# Patient Record
Sex: Male | Born: 1965 | Race: White | Hispanic: No | Marital: Single | Smoking: Current every day smoker
Health system: Southern US, Community
[De-identification: ages and names within clinical notes are randomized; demographics above are authoritative.]

## PROBLEM LIST (undated history)

## (undated) DIAGNOSIS — I10 Essential (primary) hypertension: Secondary | ICD-10-CM

## (undated) DIAGNOSIS — M549 Dorsalgia, unspecified: Secondary | ICD-10-CM

## (undated) HISTORY — PX: NO PAST SURGERIES: SHX2092

---

## 2016-12-18 ENCOUNTER — Emergency Department (HOSPITAL_COMMUNITY): Payer: Self-pay

## 2016-12-18 ENCOUNTER — Encounter (HOSPITAL_COMMUNITY): Payer: Self-pay

## 2016-12-18 ENCOUNTER — Emergency Department (HOSPITAL_COMMUNITY)
Admission: EM | Admit: 2016-12-18 | Discharge: 2016-12-18 | Disposition: A | Discharge status: Home / Self Care | Payer: Self-pay | Attending: Emergency Medicine | Admitting: Emergency Medicine

## 2016-12-18 DIAGNOSIS — F17200 Nicotine dependence, unspecified, uncomplicated: Secondary | ICD-10-CM | POA: Insufficient documentation

## 2016-12-18 DIAGNOSIS — S8992XA Unspecified injury of left lower leg, initial encounter: Secondary | ICD-10-CM | POA: Insufficient documentation

## 2016-12-18 DIAGNOSIS — S0510XA Contusion of eyeball and orbital tissues, unspecified eye, initial encounter: Secondary | ICD-10-CM | POA: Insufficient documentation

## 2016-12-18 DIAGNOSIS — Y999 Unspecified external cause status: Secondary | ICD-10-CM | POA: Insufficient documentation

## 2016-12-18 DIAGNOSIS — Y929 Unspecified place or not applicable: Secondary | ICD-10-CM | POA: Insufficient documentation

## 2016-12-18 DIAGNOSIS — S62143A Displaced fracture of body of hamate [unciform] bone, unspecified wrist, initial encounter for closed fracture: Secondary | ICD-10-CM

## 2016-12-18 DIAGNOSIS — S62145A Nondisplaced fracture of body of hamate [unciform] bone, left wrist, initial encounter for closed fracture: Secondary | ICD-10-CM | POA: Insufficient documentation

## 2016-12-18 DIAGNOSIS — Y939 Activity, unspecified: Secondary | ICD-10-CM | POA: Insufficient documentation

## 2016-12-18 MED ORDER — HYDROCODONE-ACETAMINOPHEN 5-325 MG PO TABS
1.0000 | ORAL_TABLET | Freq: Once | ORAL | Status: AC
Start: 2016-12-18 — End: 2016-12-18
  Administered 2016-12-18: 1 via ORAL
  Filled 2016-12-18: qty 1

## 2016-12-18 MED ORDER — NICOTINE 21 MG/24HR TD PT24
21.0000 mg | MEDICATED_PATCH | Freq: Once | TRANSDERMAL | Status: DC
  Administered 2016-12-18: 21 mg via TRANSDERMAL
  Filled 2016-12-18: qty 1

## 2016-12-18 MED ORDER — HYDROCODONE-ACETAMINOPHEN 5-325 MG PO TABS: 1.0000 | ORAL_TABLET | Freq: Four times a day (QID) | ORAL | 0 refills | Status: DC | PRN

## 2016-12-18 NOTE — Discharge Instructions (Addendum)
Keep splint dry and intact. No lifting with left hand. Keep left leg elevated as much as possible and ice for 20 minutes 4x/day  Pain: Take 600 mg of ibuprofen every 6 hours or 440 mg (over the counter dose) to 500 mg (prescription dose) of naproxen every 12 hours or for the next 3 days. After this time, these medications may be used as needed for pain. Take these medications with food to avoid upset stomach. Choose only one of these medications, do not take them together.  May use the Vicodin sparingly for severe pain. Do not drive or perform other dangerous activities while taking the Vicodin. Ice: May apply ice to the area over the next 24 hours for 15 minutes at a time to reduce swelling. Elevation: Keep the extremity elevated as often as possible to reduce pain and inflammation. Exercises (for the knee): Start by performing these exercises a few times a week, increasing the frequency until you are performing them twice daily.  Follow up: Follow up with the orthopedist in about 2 weeks. Call the number provided to set up an appointment.

## 2016-12-18 NOTE — Progress Notes (Signed)
Orthopedic Tech Progress Note Patient Details:  Kyle Singh 02/21/1966 [id number]  Ortho Devices Type of Ortho Device: Ace wrap, Arm sling, Ulna gutter splint Ortho Device/Splint Location: lue Ortho Device/Splint Interventions: Application   Tanja Gift 12/18/2016, 12:46 PM
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
## 2016-12-18 NOTE — ED Notes (Signed)
Patient transported to X-ray 
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
## 2017-02-08 ENCOUNTER — Emergency Department (HOSPITAL_COMMUNITY)
Admission: EM | Admit: 2017-02-08 | Discharge: 2017-02-08 | Disposition: A | Discharge status: Home / Self Care | Payer: Self-pay | Attending: Emergency Medicine | Admitting: Emergency Medicine

## 2017-02-08 ENCOUNTER — Encounter (HOSPITAL_COMMUNITY): Payer: Self-pay | Admitting: Emergency Medicine

## 2017-02-08 ENCOUNTER — Emergency Department (HOSPITAL_COMMUNITY): Payer: Self-pay

## 2017-02-08 DIAGNOSIS — F17200 Nicotine dependence, unspecified, uncomplicated: Secondary | ICD-10-CM | POA: Insufficient documentation

## 2017-02-08 DIAGNOSIS — G8929 Other chronic pain: Secondary | ICD-10-CM | POA: Insufficient documentation

## 2017-02-08 DIAGNOSIS — M25562 Pain in left knee: Secondary | ICD-10-CM | POA: Insufficient documentation
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
## 2017-02-08 NOTE — Discharge Instructions (Signed)
Wear the knee immobilizer when you are up, to prevent the knee joint from moving, which is causing the pain and swelling.  Continue to take an anti-inflammatory medication like ibuprofen 3 times a day.  You will likely need to have some surgery, to improve the left knee discomfort.

## 2017-02-18 ENCOUNTER — Encounter (INDEPENDENT_AMBULATORY_CARE_PROVIDER_SITE_OTHER): Payer: Self-pay | Admitting: Orthopedic Surgery

## 2017-02-18 ENCOUNTER — Ambulatory Visit (INDEPENDENT_AMBULATORY_CARE_PROVIDER_SITE_OTHER): Payer: Self-pay | Admitting: Orthopedic Surgery

## 2017-02-18 DIAGNOSIS — M25562 Pain in left knee: Secondary | ICD-10-CM

## 2017-02-18 MED ORDER — METHYLPREDNISOLONE ACETATE 40 MG/ML IJ SUSP
40.0000 mg | INTRAMUSCULAR | Status: AC | PRN
  Administered 2017-02-18: 40 mg via INTRA_ARTICULAR

## 2017-02-18 MED ORDER — BUPIVACAINE HCL 0.25 % IJ SOLN
4.0000 mL | INTRAMUSCULAR | Status: AC | PRN
  Administered 2017-02-18: 4 mL via INTRA_ARTICULAR

## 2017-02-18 MED ORDER — LIDOCAINE HCL 1 % IJ SOLN
5.0000 mL | INTRAMUSCULAR | Status: AC | PRN
  Administered 2017-02-18: 5 mL

## 2017-02-18 NOTE — Progress Notes (Signed)
Office Visit Note   Patient: Kyle Singh           Date of Birth: 11/30/1965           MRN: [id number] Visit Date: 02/18/2017 Requested by: No referring provider defined for this encounter. PCP: Patient, No Pcp Per  Subjective: Chief Complaint  Patient presents with  . Left Knee - Pain    HPI: Kyle Singh is a 51 year old patient with left knee pain.  Seen in emergency room twice with effusions noted on both radiographs.  Date of injury to her half months ago when he was kicking someone else light.  He does describe swelling weakness giving way and occasional locking.  Been taking ibuprofen for pain.  States the pain is constant and it does wake him from sleep at night.  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  Does not have a history of gout but does have a family history of gout              ROS: All systems reviewed are negative as they relate to the chief complaint within the history of present illness.  Patient denies  fevers or chills.   Assessment & Plan: Visit Diagnoses:  1. Acute pain of left knee     Plan: Impression is left knee effusion with some popping in giving way but difficult examination today due to guarding.  Aspiration yielded about 20 mL of serous fluid.  Plan MRI scan plus injection into the knee.  I'll see him back after that study.  Decent chance that he may have some meniscal pathology present.  Follow-Up Instructions: Return for after MRI.   Orders:  Orders Placed This Encounter  Procedures  . MR Knee Left w/o contrast   No orders of the defined types were placed in this encounter.     Procedures: Large Joint Inj Date/Time: 02/18/2017 12:53 PM Performed by: Cammy CopaEAN, SCOTT Kadin Bera Authorized by: Cammy CopaEAN, SCOTT Gabby Rackers   Consent Given by:  Patient Site marked: the procedure site was marked   Timeout: prior to procedure the correct patient, procedure, and site was verified   Indications:  Pain, joint swelling and diagnostic evaluation Location:   Knee Site:  L knee Prep: patient was prepped and draped in usual sterile fashion   Needle Size:  18 G Needle Length:  1.5 inches Approach:  Superolateral Ultrasound Guidance: No   Fluoroscopic Guidance: No   Arthrogram: No   Medications:  5 mL lidocaine 1 %; 4 mL bupivacaine 0.25 %; 40 mg methylPREDNISolone acetate 40 MG/ML Aspiration Attempted: Yes   Aspirate amount (mL):  20 Aspirate:  Yellow Patient tolerance:  Patient tolerated the procedure well with no immediate complications     Clinical Data: No additional findings.  Objective: Vital Signs: There were no vitals taken for this visit.  Physical Exam:   Constitutional: Patient appears well-developed HEENT:  Head: Normocephalic Eyes:EOM are normal Neck: Normal range of motion Cardiovascular: Normal rate Pulmonary/chest: Effort normal Neurologic: Patient is alert Skin: Skin is warm Psychiatric: Patient has normal mood and affect    Ortho Exam: Orthopedic exam demonstrates antalgic gait to the right with right knee effusion stable collateral ligaments crucial ligaments difficult to assess due to guarding.  Extensor mechanism is intact pedal pulses palpable no other masses lymph adenopathy or skin changes noted in the left knee region.  No groin pain with internal/external rotation of the leg.  Specialty Comments:  No specialty comments available.  Imaging:  No results found.   PMFS History: There are no active problems to display for this patient.  No past medical history on file.  No family history on file.  No past surgical history on file. Social History   Occupational History  . Not on file.   Social History Main Topics  . Smoking status: Current Every Day Smoker    Packs/day: 1.00  . Smokeless tobacco: Never Used  . Alcohol use No  . Drug use: Unknown  . Sexual activity: Not on file

## 2017-02-27 ENCOUNTER — Encounter (INDEPENDENT_AMBULATORY_CARE_PROVIDER_SITE_OTHER): Payer: Self-pay | Admitting: *Deleted

## 2018-08-18 ENCOUNTER — Emergency Department (HOSPITAL_COMMUNITY): Payer: Self-pay

## 2018-08-18 ENCOUNTER — Other Ambulatory Visit: Payer: Self-pay

## 2018-08-18 ENCOUNTER — Emergency Department (HOSPITAL_COMMUNITY)
Admission: EM | Admit: 2018-08-18 | Discharge: 2018-08-18 | Disposition: A | Discharge status: Home / Self Care | Payer: Self-pay | Attending: Emergency Medicine | Admitting: Emergency Medicine

## 2018-08-18 ENCOUNTER — Encounter (HOSPITAL_COMMUNITY): Payer: Self-pay | Admitting: Neurological Surgery

## 2018-08-18 DIAGNOSIS — F17200 Nicotine dependence, unspecified, uncomplicated: Secondary | ICD-10-CM | POA: Insufficient documentation

## 2018-08-18 DIAGNOSIS — M5126 Other intervertebral disc displacement, lumbar region: Secondary | ICD-10-CM | POA: Insufficient documentation

## 2018-08-18 MED ORDER — OXYCODONE-ACETAMINOPHEN 5-325 MG PO TABS: 1.0000 | ORAL_TABLET | ORAL | 0 refills | Status: DC | PRN

## 2018-08-18 MED ORDER — OXYCODONE-ACETAMINOPHEN 5-325 MG PO TABS
1.0000 | ORAL_TABLET | Freq: Once | ORAL | Status: AC
  Administered 2018-08-18: 1 via ORAL
  Filled 2018-08-18: qty 1

## 2018-08-18 MED ORDER — PREDNISONE 10 MG (21) PO TBPK: ORAL_TABLET | Freq: Every day | ORAL | 0 refills | Status: DC

## 2018-08-18 MED ORDER — METHOCARBAMOL 500 MG PO TABS: 500.0000 mg | ORAL_TABLET | Freq: Two times a day (BID) | ORAL | 0 refills | Status: DC

## 2018-08-18 MED ORDER — HYDROMORPHONE HCL 1 MG/ML IJ SOLN
1.0000 mg | Freq: Once | INTRAMUSCULAR | Status: AC
Start: 2018-08-18 — End: 2018-08-18
  Administered 2018-08-18: 1 mg via INTRAMUSCULAR
  Filled 2018-08-18: qty 1

## 2018-08-18 MED ORDER — METHYLPREDNISOLONE SODIUM SUCC 125 MG IJ SOLR
125.0000 mg | Freq: Once | INTRAMUSCULAR | Status: AC
  Administered 2018-08-18: 125 mg via INTRAMUSCULAR
  Filled 2018-08-18: qty 2

## 2018-08-18 MED ORDER — KETOROLAC TROMETHAMINE 60 MG/2ML IM SOLN
60.0000 mg | Freq: Once | INTRAMUSCULAR | Status: AC
  Administered 2018-08-18: 60 mg via INTRAMUSCULAR
  Filled 2018-08-18: qty 2

## 2018-08-18 MED ORDER — DEXAMETHASONE SODIUM PHOSPHATE 10 MG/ML IJ SOLN
10.0000 mg | Freq: Once | INTRAMUSCULAR | Status: AC
  Administered 2018-08-18: 10 mg via INTRAMUSCULAR
  Filled 2018-08-18: qty 1

## 2018-08-18 NOTE — ED Triage Notes (Addendum)
Pt reports right lower back pain that started a week ago and worsened last night. Pt reports a sharp pain that radiates down his right leg. Pt denies any injury to the area. Pt reports that prior to today ambulating after waking up helped ease the pain, but nothing seems to help today. Pt reports that ibuprofen he has been taking at home has not been helping relieve the pain. Pt reports this feels like the last time he had a pinched nerve.

## 2018-08-18 NOTE — ED Provider Notes (Signed)
[facility] EMERGENCY DEPARTMENT Provider Note   CSN: [id number] Arrival date & time: 08/18/18  0844     History   Chief Complaint Chief Complaint  Patient presents with  . Back Pain  . Leg Pain    HPI Kyle Singh is a 53 y.o. male.  The history is provided by the patient. No language interpreter was used.  Back Pain  Location:  Lumbar spine Quality:  Aching Radiates to:  R thigh and R posterior upper leg Pain severity:  Moderate Pain is:  Same all the time Onset quality:  Gradual Duration:  1 week Timing:  Constant Progression:  Worsening Chronicity:  New Relieved by:  Nothing Worsened by:  Nothing Ineffective treatments:  None tried Associated symptoms: leg pain   Leg Pain  Associated symptoms: back pain   Pt reports he has pain in his low back down his right leg.  Pt reports his right leg is weak.  Pt reports he can not lift leg,  No relief with ibuprofen   No past medical history on file.  There are no active problems to display for this patient.   No past surgical history on file.      Home Medications    Prior to Admission medications   Medication Sig Start Date End Date Taking? Authorizing Provider  HYDROcodone-acetaminophen (NORCO/VICODIN) 5-325 MG tablet Take 1 tablet by mouth every 6 (six) hours as needed. 12/18/16   Anselm Pancoast, PA-C    Family History No family history on file.  Social History Social History   Tobacco Use  . Smoking status: Current Every Day Smoker    Packs/day: 1.00  . Smokeless tobacco: Never Used  Substance Use Topics  . Alcohol use: No  . Drug use: Not on file     Allergies   Patient has no known allergies.   Review of Systems Review of Systems  Musculoskeletal: Positive for back pain.  All other systems reviewed and are negative.    Physical Exam Updated Vital Signs BP (!) 139/97   Pulse 72   Temp 98.2 F (36.8 C) (Oral)   Resp 18   Ht 5\' 8"  (1.727 m)   Wt 76.2 kg   SpO2  95%   BMI 25.54 kg/m   Physical Exam Vitals signs and nursing note reviewed.  Constitutional:      Appearance: He is well-developed.  HENT:     Head: Normocephalic.     Nose: Nose normal.     Mouth/Throat:     Mouth: Mucous membranes are moist.  Neck:     Musculoskeletal: Normal range of motion.  Cardiovascular:     Rate and Rhythm: Normal rate and regular rhythm.  Pulmonary:     Effort: Pulmonary effort is normal.  Abdominal:     General: Abdomen is flat. There is no distension.  Musculoskeletal: Normal range of motion.  Neurological:     Mental Status: He is alert and oriented to person, place, and time.      ED Treatments / Results  Labs (all labs ordered are listed, but only abnormal results are displayed) Labs Reviewed - No data to display  EKG None  Radiology Dg Lumbar Spine Complete  Result Date: 08/18/2018 CLINICAL DATA:  Back pain.  Sciatica. EXAM: LUMBAR SPINE - COMPLETE 4+ VIEW COMPARISON:  No prior. FINDINGS: Paraspinal soft tissues are normal. Diffuse multilevel degenerative change. No acute bony abnormality identified. No evidence of fracture. Normal alignment. Pelvic calcifications consistent phleboliths.  Air-filled loops of small large bowel noted consistent adynamic ileus. IMPRESSION: 1. Diffuse multilevel degenerative change. No acute bony abnormality identified. 2.  Air-filled loops of small large bowel suggesting adynamic ileus. Electronically Signed   By: Maisie Fushomas  Register   On: 08/18/2018 10:39    Procedures Procedures (including critical care time)  Medications Ordered in ED Medications  HYDROmorphone (DILAUDID) injection 1 mg (1 mg Intramuscular Given 08/18/18 0948)  ketorolac (TORADOL) injection 60 mg (60 mg Intramuscular Given 08/18/18 0948)  methylPREDNISolone sodium succinate (SOLU-MEDROL) 125 mg/2 mL injection 125 mg (125 mg Intramuscular Given 08/18/18 0948)     Initial Impression / Assessment and Plan / ED Course  I have reviewed the  triage vital signs and the nursing notes.  Pertinent labs & imaging results that were available during my care of the patient were reviewed by me and considered in my medical decision making (see chart for details).     MDM  Pt given injection of Solumedrol, dilaudid and torodol.   Dr. Madilyn Hookees in to see and examine.   MRi ordered   Pt's care turned over to Ria ClockLaura Murray PA MRI pending  Final Clinical Impressions(s) / ED Diagnoses   Final diagnoses:  Lumbar herniated disc    ED Discharge Orders    None       Osie CheeksSofia, Ladan Vanderzanden K, PA-C 08/19/18 1714    Tilden Fossaees, Elizabeth, MD 08/23/18 [phone]

## 2018-08-18 NOTE — ED Notes (Signed)
Patient transported to MRI 

## 2018-08-18 NOTE — ED Notes (Signed)
Patient transported to X-ray 

## 2018-08-18 NOTE — Consult Note (Signed)
Reason for Consult:HNP Referring Physician: EDP  Kyle Singh is an 53 y.o. male.   HPI:  53 year old gentleman who has had right low back and right leg pain for about a week.  He feels like his had weakness in the leg for a couple of days.  He is fallen once.  Has numbness around the right knee.  Pain extends into the right quadricep.  Pain is moderate to moderately severe.  Aching in character.  He is taken only ibuprofen to this point.  He states he has received steroids in the emergency department.  MRI showed herniated disc at L4-5 on the right and neurosurgical evaluation was requested.  His pain level was at its worst last night  History reviewed. No pertinent past medical history.  History reviewed. No pertinent surgical history.  No Known Allergies  Social History   Tobacco Use  . Smoking status: Current Every Day Smoker    Packs/day: 1.00  . Smokeless tobacco: Never Used  Substance Use Topics  . Alcohol use: No    History reviewed. No pertinent family history.   Review of Systems  Positive ROS: neg  All other systems have been reviewed and were otherwise negative with the exception of those mentioned in the HPI and as above.  Objective: Vital signs in last 24 hours: Temp:  [98.2 F (36.8 C)] 98.2 F (36.8 C) (01/29 0852) Pulse Rate:  [72-81] 81 (01/29 1734) Resp:  [17-18] 18 (01/29 1734) BP: (137-139)/(83-111) 137/83 (01/29 1734) SpO2:  [94 %-99 %] 99 % (01/29 1734) Weight:  [76.2 kg] 76.2 kg (01/29 0901)  General Appearance: Alert, cooperative, no distress, appears stated age Head: Normocephalic, without obvious abnormality, atraumatic Eyes: PERRL, conjunctiva/corneas clear, EOM's intact, fundi benign, both eyes      Ears: Normal TM's and external ear canals, both ears Throat: benign Neck: Supple, symmetrical, trachea midline, no adenopathy; thyroid: No enlargement/tenderness/nodules; no carotid bruit or JVD Back: Symmetric, no curvature, ROM normal, no CVA  tenderness Lungs: Clear to auscultation bilaterally, respirations unlabored Heart: Regular rate and rhythm, S1 and S2 normal, no murmur, rub or gallop Abdomen: Soft, non-tender, bowel sounds active all four quadrants, no masses, no organomegaly Extremities: Extremities normal, atraumatic, no cyanosis or edema Pulses: 2+ and symmetric all extremities Skin: Skin color, texture, turgor normal, no rashes or lesions  NEUROLOGIC:   Mental status: A&O x4, no aphasia, good attention span, Memory and fund of knowledge Motor Exam - grossly normal, normal tone and bulk except for right quadricep is 2 out of 5 Sensory Exam - grossly normal Reflexes: symmetric for loss of right knee jerk, no pathologic reflexes, No Hoffman's, No clonus Coordination - grossly normal Gait -not tested Balance -not tested Cranial Nerves: I: smell Not tested  II: visual acuity  OS: na    OD: na  II: visual fields Full to confrontation  II: pupils Equal, round, reactive to light  III,VII: ptosis None  III,IV,VI: extraocular muscles  Full ROM  V: mastication Normal  V: facial light touch sensation  Normal  V,VII: corneal reflex  Present  VII: facial muscle function - upper  Normal  VII: facial muscle function - lower Normal  VIII: hearing Not tested  IX: soft palate elevation  Normal  IX,X: gag reflex Present  XI: trapezius strength  5/5  XI: sternocleidomastoid strength 5/5  XI: neck flexion strength  5/5  XII: tongue strength  Normal    Data Review No results found for: WBC, HGB, HCT, MCV, PLT  No results found for: NA, K, CL, CO2, BUN, CREATININE, GLUCOSE No results found for: INR, PROTIME  Radiology: Dg Lumbar Spine Complete  Result Date: 08/18/2018 CLINICAL DATA:  Back pain.  Sciatica. EXAM: LUMBAR SPINE - COMPLETE 4+ VIEW COMPARISON:  No prior. FINDINGS: Paraspinal soft tissues are normal. Diffuse multilevel degenerative change. No acute bony abnormality identified. No evidence of fracture. Normal  alignment. Pelvic calcifications consistent phleboliths. Air-filled loops of small large bowel noted consistent adynamic ileus. IMPRESSION: 1. Diffuse multilevel degenerative change. No acute bony abnormality identified. 2.  Air-filled loops of small large bowel suggesting adynamic ileus. Electronically Signed   By: Maisie Fus  Register   On: 08/18/2018 10:39   Mr Lumbar Spine Wo Contrast  Result Date: 08/18/2018 CLINICAL DATA:  Right-sided low back pain beginning 1 week ago, worsening last night. Pain radiates to the right leg. EXAM: MRI LUMBAR SPINE WITHOUT CONTRAST TECHNIQUE: Multiplanar, multisequence MR imaging of the lumbar spine was performed. No intravenous contrast was administered. COMPARISON:  Radiography same day FINDINGS: Segmentation:  5 lumbar type vertebral bodies. Alignment:  Straightening of the normal lumbar lordosis. Vertebrae:  No fracture or primary bone lesion. Conus medullaris and cauda equina: Conus extends to the L1 level. Conus and cauda equina appear normal. Paraspinal and other soft tissues: Negative Disc levels: T12-L1 is normal. L1-2: Minimal desiccation and bulging of the disc. No compressive stenosis. L2-3: Biforaminal disc bulges. No central canal stenosis. Mild foraminal narrowing without visible neural compression. L3-4: Biforaminal disc bulges larger on the right than the left. No central canal stenosis. Mild to moderate foraminal narrowing without visible neural compression. L4-5: Right foraminal disc herniation likely to compress the right L4 nerve. Foraminal disc bulge on the left without visible neural compression. Bilateral lateral recess stenosis which could possibly affect either or both L5 nerves. L5-S1: Disc bulge more prominent towards the left. Mild facet hypertrophy. Narrowing of the subarticular lateral recesses and foramina left more than right. Some potential for neural compression on the left. IMPRESSION: The dominant and likely acute finding is a right foraminal  disc herniation at L4-5 likely to compress the right L4 nerve. Also at that level, there is bilateral lateral recess stenosis and mild left foraminal narrowing. L2-3: Bilateral foraminal disc bulges but without visible neural compression. L3-4: Bilateral foraminal disc bulges right more than left, but without visible neural compression. L5-S1: Disc bulge more prominent towards the left. Facet hypertrophy. Narrowing of the subarticular lateral recesses and foramina left more than right. Some potential for neural compression at this level, particularly on the left. Electronically Signed   By: Paulina Fusi M.D.   On: 08/18/2018 16:20     Assessment/Plan: Estimated body mass index is 25.54 kg/m as calculated from the following:   Height as of this encounter: 5\' 8"  (1.727 m).   Weight as of this encounter: 41.54 kg.   53 year old gentleman with a extraforaminal disc protrusion L4-5 on the right causing right L4 radiculopathy and fairly significant weakness of the right quadricep.  I have recommended early surgery because of the weakness, but he refuses admission and surgery at this time.  He would "like to give it a few days."  He understands the risks in this include permanent weakness of the leg.  He understands that early surgery does not guarantee return of strength and lack of surgery does not guarantee loss of strength, however he understands that early surgery would certainly improve the likelihood of return of leg strength.  Recommend discharge home with prescriptions  for medications to palliate the pain.  I suggested a Medrol Dosepak or other type of steroid taper to put him on steroids to reduce inflammation of the neural elements.  He will follow-up with me in the office on Tuesday.  My name and number are in the chart and should appear on his paperwork.  Gone over all of this with him in detail and at length and answered all of his questions the best my ability and has demonstrated  understanding.   Tia AlertDavid S Jones 08/18/2018 6:29 PM

## 2018-08-18 NOTE — ED Provider Notes (Signed)
Care received at shift change, briefly, patient with low back pain x 1 week, right leg weakness/difficulty walking since last night. No falls or injuries. No previous back problems. Sleeping in recliner due to pain lying supine/in bed.  Unable to extend lower leg, able to flex hip with pain, no foot/great toe weakness. absent patella reflex. Awaiting MRI lumbar spine. Physical Exam  BP 137/83 (BP Location: Left Arm)   Pulse 81   Temp 98.2 F (36.8 C) (Oral)   Resp 18   Ht 5\' 8"  (1.727 m)   Wt 76.2 kg   SpO2 99%   BMI 25.54 kg/m   Physical Exam  ED Course/Procedures     Procedures  MDM  Case discussed with on call neurosurgery, recommends 10mg  Decadron, Attending physician is on site in the hospital, will come and see patient. Discussed plan of care with patient who will be moved to a room.  Patient was seen by neurosurgery, note reviewed, patient would like to be discharged today.  Given prescription for Percocet, Robaxin, prednisone.  Patient to follow-up with neurosurgery in 6 days, advised return to ER for new or worsening symptoms.       Jeannie Fend, PA-C 08/18/18 1901    Tilden Fossa, MD 08/23/18 [phone]

## 2018-08-18 NOTE — Discharge Instructions (Addendum)
Follow-up with neurosurgery as directed.  Return to ER for any new or worsening symptoms. Take scription's as directed including prednisone taper and complete the full course.  Take Percocet as needed as prescribed for pain, do not drive or operate machinery while taking Percocet.  Take Robaxin as needed for muscle spasm.

## 2018-08-23 ENCOUNTER — Encounter (HOSPITAL_COMMUNITY): Payer: Self-pay

## 2018-08-23 ENCOUNTER — Emergency Department (HOSPITAL_COMMUNITY): Payer: Self-pay

## 2018-08-23 ENCOUNTER — Other Ambulatory Visit: Payer: Self-pay

## 2018-08-23 ENCOUNTER — Inpatient Hospital Stay (HOSPITAL_COMMUNITY)
Admission: EM | Admit: 2018-08-23 | Discharge: 2018-08-26 | DRG: 520 | Disposition: A | Discharge status: Home / Self Care | Payer: Self-pay | Attending: Neurological Surgery | Admitting: Neurological Surgery

## 2018-08-23 DIAGNOSIS — M5126 Other intervertebral disc displacement, lumbar region: Principal | ICD-10-CM | POA: Diagnosis present

## 2018-08-23 DIAGNOSIS — F1410 Cocaine abuse, uncomplicated: Secondary | ICD-10-CM | POA: Diagnosis present

## 2018-08-23 DIAGNOSIS — Z9889 Other specified postprocedural states: Secondary | ICD-10-CM

## 2018-08-23 DIAGNOSIS — I10 Essential (primary) hypertension: Secondary | ICD-10-CM | POA: Diagnosis present

## 2018-08-23 DIAGNOSIS — M5441 Lumbago with sciatica, right side: Secondary | ICD-10-CM

## 2018-08-23 DIAGNOSIS — Z419 Encounter for procedure for purposes other than remedying health state, unspecified: Secondary | ICD-10-CM

## 2018-08-23 DIAGNOSIS — Z79891 Long term (current) use of opiate analgesic: Secondary | ICD-10-CM

## 2018-08-23 DIAGNOSIS — M549 Dorsalgia, unspecified: Secondary | ICD-10-CM | POA: Diagnosis present

## 2018-08-23 DIAGNOSIS — F17210 Nicotine dependence, cigarettes, uncomplicated: Secondary | ICD-10-CM | POA: Diagnosis present

## 2018-08-23 DIAGNOSIS — Z7952 Long term (current) use of systemic steroids: Secondary | ICD-10-CM

## 2018-08-23 HISTORY — DX: Essential (primary) hypertension: I10

## 2018-08-23 LAB — CBC
HCT: 46.4 % (ref 39.0–52.0)
HCT: 49.1 % (ref 39.0–52.0)
Hemoglobin: 16.2 g/dL (ref 13.0–17.0)
Hemoglobin: 16.3 g/dL (ref 13.0–17.0)
MCH: 31.3 pg (ref 26.0–34.0)
MCH: 30.1 pg (ref 26.0–34.0)
MCHC: 34.9 g/dL (ref 30.0–36.0)
MCHC: 33.2 g/dL (ref 30.0–36.0)
MCV: 89.7 fL (ref 80.0–100.0)
MCV: 90.8 fL (ref 80.0–100.0)
NRBC: 0 % (ref 0.0–0.2)
PLATELETS: 227 10*3/uL (ref 150–400)
Platelets: 218 10*3/uL (ref 150–400)
RBC: 5.17 MIL/uL (ref 4.22–5.81)
RBC: 5.41 MIL/uL (ref 4.22–5.81)
RDW: 11.7 % (ref 11.5–15.5)
RDW: 11.5 % (ref 11.5–15.5)
WBC: 17.1 10*3/uL — AB (ref 4.0–10.5)
WBC: 14.5 10*3/uL — ABNORMAL HIGH (ref 4.0–10.5)
nRBC: 0 % (ref 0.0–0.2)

## 2018-08-23 LAB — BASIC METABOLIC PANEL
Anion gap: 12 (ref 5–15)
BUN: 25 mg/dL — ABNORMAL HIGH (ref 6–20)
CALCIUM: 8.7 mg/dL — AB (ref 8.9–10.3)
CO2: 23 mmol/L (ref 22–32)
Chloride: 103 mmol/L (ref 98–111)
Creatinine, Ser: 0.97 mg/dL (ref 0.61–1.24)
GFR calc Af Amer: >60 mL/min (ref >60)
GFR calc non Af Amer: >60 mL/min (ref >60)
Glucose, Bld: 116 mg/dL — ABNORMAL HIGH (ref 70–99)
Potassium: 4 mmol/L (ref 3.5–5.1)
Sodium: 138 mmol/L (ref 135–145)

## 2018-08-23 LAB — RAPID URINE DRUG SCREEN, HOSP PERFORMED
Amphetamines: NOT DETECTED
Barbiturates: NOT DETECTED
Benzodiazepines: NOT DETECTED
COCAINE: NOT DETECTED
Opiates: POSITIVE — AB
Tetrahydrocannabinol: POSITIVE — AB

## 2018-08-23 LAB — CREATININE, SERUM
Creatinine, Ser: 1 mg/dL (ref 0.61–1.24)
GFR calc Af Amer: >60 mL/min (ref >60)
GFR calc non Af Amer: >60 mL/min (ref >60)

## 2018-08-23 LAB — PROTIME-INR
INR: 0.95
Prothrombin Time: 12.6 seconds (ref 11.4–15.2)

## 2018-08-23 MED ORDER — IBUPROFEN 600 MG PO TABS: 600.0000 mg | ORAL_TABLET | Freq: Four times a day (QID) | ORAL | Status: DC | PRN

## 2018-08-23 MED ORDER — LACTATED RINGERS IV SOLN
INTRAVENOUS | Status: AC
  Administered 2018-08-24 (×2): via INTRAVENOUS

## 2018-08-23 MED ORDER — DEXAMETHASONE SODIUM PHOSPHATE 10 MG/ML IJ SOLN
10.0000 mg | Freq: Four times a day (QID) | INTRAMUSCULAR | Status: DC
  Administered 2018-08-23 – 2018-08-24 (×3): 10 mg via INTRAVENOUS
  Filled 2018-08-23 (×3): qty 1

## 2018-08-23 MED ORDER — BISACODYL 5 MG PO TBEC: 5.0000 mg | DELAYED_RELEASE_TABLET | Freq: Every day | ORAL | Status: DC | PRN

## 2018-08-23 MED ORDER — SODIUM CHLORIDE 0.9% FLUSH
3.0000 mL | Freq: Two times a day (BID) | INTRAVENOUS | Status: DC
  Administered 2018-08-23 – 2018-08-25 (×4): 3 mL via INTRAVENOUS

## 2018-08-23 MED ORDER — HYDRALAZINE HCL 20 MG/ML IJ SOLN: 10.0000 mg | Freq: Four times a day (QID) | INTRAMUSCULAR | Status: DC | PRN

## 2018-08-23 MED ORDER — HYDROMORPHONE HCL 1 MG/ML IJ SOLN
1.0000 mg | INTRAMUSCULAR | Status: DC | PRN
  Administered 2018-08-23 – 2018-08-25 (×11): 1 mg via INTRAVENOUS
  Filled 2018-08-23 (×11): qty 1

## 2018-08-23 MED ORDER — OXYCODONE-ACETAMINOPHEN 7.5-325 MG PO TABS
1.0000 | ORAL_TABLET | ORAL | Status: DC | PRN
  Administered 2018-08-23 – 2018-08-24 (×5): 1 via ORAL
  Filled 2018-08-23 (×5): qty 1

## 2018-08-23 MED ORDER — AMLODIPINE BESYLATE 10 MG PO TABS
10.0000 mg | ORAL_TABLET | Freq: Every day | ORAL | Status: DC
  Administered 2018-08-24: 10 mg via ORAL
  Filled 2018-08-23: qty 2
  Filled 2018-08-23: qty 1

## 2018-08-23 MED ORDER — BACLOFEN 10 MG PO TABS
10.0000 mg | ORAL_TABLET | Freq: Three times a day (TID) | ORAL | Status: DC | PRN
  Filled 2018-08-23: qty 1

## 2018-08-23 MED ORDER — DEXAMETHASONE SODIUM PHOSPHATE 10 MG/ML IJ SOLN
10.0000 mg | Freq: Once | INTRAMUSCULAR | Status: AC
  Administered 2018-08-23: 10 mg via INTRAVENOUS
  Filled 2018-08-23: qty 1

## 2018-08-23 MED ORDER — METHOCARBAMOL 1000 MG/10ML IJ SOLN
1000.0000 mg | Freq: Once | INTRAMUSCULAR | Status: DC
  Filled 2018-08-23: qty 10

## 2018-08-23 MED ORDER — SODIUM CHLORIDE 0.9 % IV BOLUS: 1000.0000 mL | Freq: Once | INTRAVENOUS | Status: DC

## 2018-08-23 MED ORDER — HEPARIN SODIUM (PORCINE) 5000 UNIT/ML IJ SOLN
5000.0000 [IU] | Freq: Three times a day (TID) | INTRAMUSCULAR | Status: DC
  Administered 2018-08-23 – 2018-08-24 (×2): 5000 [IU] via SUBCUTANEOUS
  Filled 2018-08-23 (×2): qty 1

## 2018-08-23 MED ORDER — MORPHINE SULFATE (PF) 4 MG/ML IV SOLN
4.0000 mg | Freq: Once | INTRAVENOUS | Status: AC
  Administered 2018-08-23: 4 mg via INTRAVENOUS
  Filled 2018-08-23: qty 1

## 2018-08-23 NOTE — ED Triage Notes (Signed)
Pt reports recent dx of herniated disc with a pinched nerve that's causing severe pain.  Pt reports passing out last night this morning from the pain.  Pt denies falling with he passed out that "Ive been on th couch."  Pt reports taking 3 medications that were prescribed with no relief.

## 2018-08-23 NOTE — H&P (Signed)
TRH H&P   Patient Demographics:    Kyle Singh, is a 53 y.o. male  MRN: [id number]   DOB - 01/30/1966  Admit Date - 08/23/2018  Outpatient Primary MD for the patient is Patient, No Pcp Per  Outpatient Specialists: Dr Yetta BarreJones    Patient coming from: Home  Chief Complaint  Patient presents with  . Back Pain      HPI:    Kyle LekKeith Broman  is a 53 y.o. male, with no previous medical history except occasional cocaine abuse, left excruciating acute onset low back pain on 08/18/2018, he denies any injuries to that site, he says that he was sleeping in a recliner and woke up with pain, he had an MRI in the ER that day which showed herniated disc at L4-L5 level, he was seen by neurosurgeon Dr. Yetta BarreJones and a surgical correction was offered but patient declined and went home.  He comes back today with worsening of his low back pain which is radiating to his right leg, he also complains of some numbness in the right side of his thigh and some weakness of his foot.  Denies any bowel or bladder incontinence.  Denies any paresthesias in his buttocks area.  In the ER blood work is still pending, neurosurgery was consulted and we were requested to admit for pain control.  Denies any fever chills, no other medical problems, denies any IV drug use ever.    Review of systems:    A full 10 point Review of Systems was done, except as stated above, all other Review of Systems were negative.   With Past History of the following :    History reviewed. No pertinent past medical history.    History reviewed. No pertinent surgical history.    Social History:     Social History   Tobacco Use  . Smoking status: Current Every Day Smoker   Packs/day: 1.00  . Smokeless tobacco: Never Used  Substance Use Topics  . Alcohol use: No         Family History :   No disc problems or premature CAD in the family   Home Medications:   Prior to Admission medications   Medication Sig Start Date End Date Taking? Authorizing Provider  HYDROcodone-acetaminophen (NORCO/VICODIN) 5-325 MG tablet Take 1 tablet by mouth every 6 (six) hours  as needed. 12/18/16   Joy, Shawn C, PA-C  methocarbamol (ROBAXIN) 500 MG tablet Take 1 tablet (500 mg total) by mouth 2 (two) times daily. 08/18/18   Jeannie FendMurphy, Laura A, PA-C  oxyCODONE-acetaminophen (PERCOCET/ROXICET) 5-325 MG tablet Take 1 tablet by mouth every 4 (four) hours as needed for severe pain. 08/18/18   Jeannie FendMurphy, Laura A, PA-C  predniSONE (STERAPRED UNI-PAK 21 TAB) 10 MG (21) TBPK tablet Take by mouth daily. Take 6 tabs by mouth daily  for 2 days, then 5 tabs for 2 days, then 4 tabs for 2 days, then 3 tabs for 2 days, 2 tabs for 2 days, then 1 tab by mouth daily for 2 days 08/18/18   Jeannie FendMurphy, Laura A, PA-C     Allergies:    No Known Allergies   Physical Exam:   Vitals  Blood pressure (!) 148/106, pulse 82, temperature 98 F (36.7 C), temperature source Oral, resp. rate 18, height 5\' 8"  (1.727 m), weight 76.2 kg, SpO2 96 %.   1. General Young Caucasian male with multiple tattoos all over lying in hospital bed in moderate to severe distress due to low back pain,  2. Normal affect and insight, Not Suicidal or Homicidal, Awake Alert, Oriented X 3.  3. No F.N deficits, ALL C.Nerves Intact, strength is 5 x 5 in all 4 extremities except for right foot weakness in dorsiflexion, also numbness in L4-L5 dermatome in the right medial aspect of his thigh.  Unable to extend his x-rays right hip due to pain.  4. Ears and Eyes appear Normal, Conjunctivae clear, PERRLA. Moist Oral Mucosa.  5. Supple Neck, No JVD, No cervical lymphadenopathy appriciated, No Carotid Bruits.  6. Symmetrical Chest wall movement,  Good air movement bilaterally, CTAB.  7. RRR, No Gallops, Rubs or Murmurs, No Parasternal Heave.  8. Positive Bowel Sounds, Abdomen Soft, No tenderness, No organomegaly appriciated,No rebound -guarding or rigidity.  9.  No Cyanosis, Normal Skin Turgor, No Skin Rash or Bruise.  10. Good muscle tone,  joints appear normal , no effusions, Normal ROM.  11. No Palpable Lymph Nodes in Neck or Axillae      Data Review:    CBC Recent Labs  Lab 08/23/18 1743  WBC 17.1*  HGB 16.2  HCT 46.4  PLT 218  MCV 89.7  MCH 31.3  MCHC 34.9  RDW 11.7   ------------------------------------------------------------------------------------------------------------------  Chemistries  No results for input(s): NA, K, CL, CO2, GLUCOSE, BUN, CREATININE, CALCIUM, MG, AST, ALT, ALKPHOS, BILITOT in the last 168 hours.  Invalid input(s): GFRCGP ------------------------------------------------------------------------------------------------------------------ CrCl cannot be calculated (No successful lab value found.). ------------------------------------------------------------------------------------------------------------------ No results for input(s): TSH, T4TOTAL, T3FREE, THYROIDAB in the last 72 hours.  Invalid input(s): FREET3  Coagulation profile No results for input(s): INR, PROTIME in the last 168 hours. ------------------------------------------------------------------------------------------------------------------- No results for input(s): DDIMER in the last 72 hours. -------------------------------------------------------------------------------------------------------------------  Cardiac Enzymes No results for input(s): CKMB, TROPONINI, MYOGLOBIN in the last 168 hours.  Invalid input(s): CK ------------------------------------------------------------------------------------------------------------------ No results found for:  BNP   ---------------------------------------------------------------------------------------------------------------  Urinalysis No results found for: COLORURINE, APPEARANCEUR, LABSPEC, PHURINE, GLUCOSEU, HGBUR, BILIRUBINUR, KETONESUR, PROTEINUR, UROBILINOGEN, NITRITE, LEUKOCYTESUR  ----------------------------------------------------------------------------------------------------------------   Imaging Results:    Dg Chest Port 1 View  Result Date: 08/23/2018 CLINICAL DATA:  Herniated disc, pinched nerve, severe pain, preoperative evaluation EXAM: PORTABLE CHEST 1 VIEW COMPARISON:  Portable exam 1744 hours without priors for comparison FINDINGS: Normal heart size, mediastinal contours, and pulmonary vascularity. Lungs clear. No pleural effusion or pneumothorax. Bones unremarkable. IMPRESSION:  No acute abnormalities. Electronically Signed   By: Ulyses Southward M.D.   On: 08/23/2018 17:54    EKG and BMP are pending   Assessment & Plan:    Active Problems:   Back pain    1.  L4-L5 disc prolapse with low back pain which is excruciating, right foot weakness, right leg paresthesias.  Patient was offered surgical correction few days ago but he declined, now unable to stand or walk, in considerable discomfort, will be admitted to MedSurg, IV steroids, IV narcotics along with oral narcotics and sedatives, gentle hydration, likely has reactive leukocytosis will monitor.  Case discussed with Dr. Yetta Barre he will operate likely on 08/25/2018.  2.  Active leukocytosis.  Likely reactive, no fevers, denies any IV drug use.  Monitor.  Chest x-ray stable.  3.  Occasional cocaine abuse.  Last use 1 month ago.  Counseled to quit.  4.  HTN.  Likely pain related.  Placed on Norvasc along with PRN hydralazine.   Monitor BMP and EKG which are pending.   DVT Prophylaxis Heparin   AM Labs Ordered, also please review Full Orders  Family Communication: Admission, patients condition and plan of care including  tests being ordered have been discussed with the patient  who indicate understanding and agree with the plan and Code Status.  Code Status Full  Likely DC to Home 3-4   Condition GUARDED    Consults called: N. Surg   Admission status: Inpt    Time spent in minutes : 35   Susa Raring M.D on 08/23/2018 at 6:07 PM  To page go to [email or web address] - password [facility]

## 2018-08-23 NOTE — ED Provider Notes (Addendum)
[name]   CSN: [id number] Arrival date & time: 08/23/18  1508     History   Chief Complaint Chief Complaint  Patient presents with  . Back Pain    HPI Kyle Singh is a 53 y.o. male.  HPI  53 yo male presents today with severe right-sided back pain.  He states he was seen here on January 29.  He had a herniated disc and right leg weakness.  Dr. Yetta BarreJones saw him and advised going to the operating room.  Patient wanted to avoid surgery and went home.  He is having increasing pain.  He is having severe pain and states he is unable to tolerate.  The leg weakness has continued.  He has not lost control of his bowel or bladder or sensation.  He states he is unable to extend his right leg and does not have reflexes.  Review of Dr. Yetta BarreJones note is consistent with this.  History reviewed. No pertinent past medical history.  Patient Active Problem List   Diagnosis Date Noted  . Back pain 08/23/2018    History reviewed. No pertinent surgical history.      Home Medications    Prior to Admission medications   Medication Sig Start Date End Date Taking? Authorizing Provider  HYDROcodone-acetaminophen (NORCO/VICODIN) 5-325 MG tablet Take 1 tablet by mouth every 6 (six) hours as needed. 12/18/16   Joy, Shawn C, PA-C  methocarbamol (ROBAXIN) 500 MG tablet Take 1 tablet (500 mg total) by mouth 2 (two) times daily. 08/18/18   Jeannie FendMurphy, Laura A, PA-C  oxyCODONE-acetaminophen (PERCOCET/ROXICET) 5-325 MG tablet Take 1 tablet by mouth every 4 (four) hours as needed for severe pain. 08/18/18   Jeannie FendMurphy, Laura A, PA-C  predniSONE (STERAPRED UNI-PAK 21 TAB) 10 MG (21) TBPK tablet Take by mouth daily. Take 6 tabs by mouth daily  for 2 days, then 5 tabs for 2 days, then 4 tabs for 2 days, then 3 tabs for 2 days, 2 tabs for 2 days, then 1 tab by mouth daily for 2 days 08/18/18   Jeannie FendMurphy, Laura A, PA-C    Family History History reviewed. No pertinent family  history.  Social History Social History   Tobacco Use  . Smoking status: Current Every Day Smoker    Packs/day: 1.00  . Smokeless tobacco: Never Used  Substance Use Topics  . Alcohol use: No  . Drug use: Not on file     Allergies   Patient has no known allergies.   Review of Systems Review of Systems  All other systems reviewed and are negative.    Physical Exam Updated Vital Signs BP (!) 148/106 (BP Location: Right Arm)   Pulse 82   Temp 98 F (36.7 C) (Oral)   Resp 18   Ht 1.727 m (5\' 8" )   Wt 76.2 kg   SpO2 96%   BMI 25.54 kg/m   Physical Exam Vitals signs and nursing note reviewed.  Constitutional:      General: He is in acute distress.  HENT:     Head: Normocephalic.     Right Ear: External ear normal.     Left Ear: External ear normal.     Nose: Nose normal.     Mouth/Throat:     Mouth: Mucous membranes are moist.  Eyes:     Pupils: Pupils are equal, round, and reactive to light.  Neck:     Musculoskeletal: Normal range of motion.  Cardiovascular:  Rate and Rhythm: Normal rate and regular rhythm.  Pulmonary:     Effort: Pulmonary effort is normal.     Breath sounds: Normal breath sounds.  Abdominal:     General: Abdomen is flat.  Musculoskeletal:     Comments: Patient unable to extend right leg  Skin:    General: Skin is warm and dry.     Capillary Refill: Capillary refill takes less than 2 seconds.  Neurological:     Mental Status: He is alert.     Cranial Nerves: No cranial nerve deficit.     Sensory: No sensory deficit.     Motor: Weakness present.     Coordination: Coordination normal.     Comments: Patient with no patellar reflex on the right Decreased ability to extend right leg at knee. He is able to flex at hip      ED Treatments / Results  Labs (all labs ordered are listed, but only abnormal results are displayed) Labs Reviewed  CBC - Abnormal; Notable for the following components:      Result Value   WBC 17.1 (*)      All other components within normal limits  BASIC METABOLIC PANEL - Abnormal; Notable for the following components:   Glucose, Bld 116 (*)    BUN 25 (*)    Calcium 8.7 (*)    All other components within normal limits  HIV ANTIBODY (ROUTINE TESTING W REFLEX)  CBC  CREATININE, SERUM  PROTIME-INR  CBC  BASIC METABOLIC PANEL    EKG None  Radiology Dg Chest Port 1 View  Result Date: 08/23/2018 CLINICAL DATA:  Herniated disc, pinched nerve, severe pain, preoperative evaluation EXAM: PORTABLE CHEST 1 VIEW COMPARISON:  Portable exam 1744 hours without priors for comparison FINDINGS: Normal heart size, mediastinal contours, and pulmonary vascularity. Lungs clear. No pleural effusion or pneumothorax. Bones unremarkable. IMPRESSION: No acute abnormalities. Electronically Signed   By: Ulyses Southward M.D.   On: 08/23/2018 17:54    Procedures Procedures (including critical care time)  Medications Ordered in ED Medications  sodium chloride 0.9 % bolus 1,000 mL (1,000 mLs Intravenous Refused 08/23/18 1704)  methocarbamol (ROBAXIN) injection 1,000 mg (has no administration in time range)  dexamethasone (DECADRON) injection 10 mg (has no administration in time range)  oxyCODONE-acetaminophen (PERCOCET) 7.5-325 MG per tablet 1 tablet (has no administration in time range)  HYDROmorphone (DILAUDID) injection 1 mg (has no administration in time range)  ibuprofen (ADVIL,MOTRIN) tablet 600 mg (has no administration in time range)  lactated ringers infusion (has no administration in time range)  heparin injection 5,000 Units (has no administration in time range)  sodium chloride flush (NS) 0.9 % injection 3 mL (has no administration in time range)  bisacodyl (DULCOLAX) EC tablet 5 mg (has no administration in time range)  hydrALAZINE (APRESOLINE) injection 10 mg (has no administration in time range)  amLODipine (NORVASC) tablet 10 mg (has no administration in time range)  morphine 4 MG/ML injection 4 mg  (4 mg Intravenous Given 08/23/18 1702)  dexamethasone (DECADRON) injection 10 mg (10 mg Intravenous Given 08/23/18 1702)     Initial Impression / Assessment and Plan / ED Course  I have reviewed the triage vital signs and the nursing notes.  Pertinent labs & imaging results that were available during my care of the patient were reviewed by me and considered in my medical decision making (see chart for details).  Clinical Course as of Aug 27 1540  Mon Aug 23, 2018  4975  Suspect acute demargination with steroids and pain  WBC(!): 17.1 [DR]    Clinical Course User Index [DR] Margarita Grizzleay, Jeanice Dempsey, MD    Discussed care with Dr. Lovell SheehanJenkins, on-call for Dr Yetta BarreJones.  Discussed care with Dr. Thedore MinsSingh the hospitalist service with neurosurgery to consult in the morning Patient with acute neurological deficit Unchanged from last week Treated here with Decadron, Robaxin, and pain medication Final Clinical Impressions(s) / ED Diagnoses   Final diagnoses:  Herniated lumbar intervertebral disc    ED Discharge Orders    None       Margarita Grizzleay, Akari Crysler, MD 08/23/18 Herbie [id number]    Margarita Grizzleay, Manus Weedman, MD 08/27/18 [phone]

## 2018-08-24 ENCOUNTER — Other Ambulatory Visit: Payer: Self-pay | Admitting: Neurological Surgery

## 2018-08-24 DIAGNOSIS — F1410 Cocaine abuse, uncomplicated: Secondary | ICD-10-CM | POA: Diagnosis present

## 2018-08-24 DIAGNOSIS — I10 Essential (primary) hypertension: Secondary | ICD-10-CM | POA: Diagnosis present

## 2018-08-24 LAB — BASIC METABOLIC PANEL
Anion gap: 12 (ref 5–15)
BUN: 23 mg/dL — ABNORMAL HIGH (ref 6–20)
CO2: 24 mmol/L (ref 22–32)
Calcium: 8.8 mg/dL — ABNORMAL LOW (ref 8.9–10.3)
Chloride: 101 mmol/L (ref 98–111)
Creatinine, Ser: 1.02 mg/dL (ref 0.61–1.24)
GFR calc Af Amer: >60 mL/min (ref >60)
GFR calc non Af Amer: >60 mL/min (ref >60)
Glucose, Bld: 149 mg/dL — ABNORMAL HIGH (ref 70–99)
Potassium: 4.6 mmol/L (ref 3.5–5.1)
Sodium: 137 mmol/L (ref 135–145)

## 2018-08-24 LAB — CBC
HCT: 48.3 % (ref 39.0–52.0)
Hemoglobin: 16.7 g/dL (ref 13.0–17.0)
MCH: 31.2 pg (ref 26.0–34.0)
MCHC: 34.6 g/dL (ref 30.0–36.0)
MCV: 90.1 fL (ref 80.0–100.0)
NRBC: 0 % (ref 0.0–0.2)
PLATELETS: 257 10*3/uL (ref 150–400)
RBC: 5.36 MIL/uL (ref 4.22–5.81)
RDW: 11.8 % (ref 11.5–15.5)
WBC: 16.9 10*3/uL — ABNORMAL HIGH (ref 4.0–10.5)

## 2018-08-24 LAB — HIV ANTIBODY (ROUTINE TESTING W REFLEX): HIV Screen 4th Generation wRfx: NONREACTIVE

## 2018-08-24 MED ORDER — ONDANSETRON HCL 4 MG/2ML IJ SOLN
4.0000 mg | Freq: Four times a day (QID) | INTRAMUSCULAR | Status: DC | PRN
  Administered 2018-08-24 – 2018-08-25 (×2): 4 mg via INTRAVENOUS
  Filled 2018-08-24 (×2): qty 2

## 2018-08-24 MED ORDER — ALUM & MAG HYDROXIDE-SIMETH 200-200-20 MG/5ML PO SUSP
30.0000 mL | Freq: Four times a day (QID) | ORAL | Status: DC | PRN
  Administered 2018-08-24: 30 mL via ORAL
  Filled 2018-08-24 (×2): qty 30

## 2018-08-24 NOTE — Progress Notes (Signed)
Subjective: Patient reports a lot of pain over night mostly in his right lateral thigh and shin. Does report some back pain. Still has weakness in his right leg  Objective: Vital signs in last 24 hours: Temp:  [97.5 F (36.4 C)-98 F (36.7 C)] 97.6 F (36.4 C) (02/04 0531) Pulse Rate:  [67-82] 70 (02/04 0531) Resp:  [12-18] 12 (02/04 0531) BP: (131-154)/(83-106) 131/84 (02/04 0531) SpO2:  [96 %-98 %] 96 % (02/04 0531) Weight:  [75 kg-76.2 kg] 75 kg (02/04 0110)  Intake/Output from previous day: 02/03 0701 - 02/04 0700 In: 195 [I.V.:195] Out: 470 [Urine:470] Intake/Output this shift: Total I/O In: 330 [I.V.:330] Out: -   Neurologic: Grossly normal except right quad strength is 1/5  Lab Results: Lab Results  Component Value Date   WBC 16.9 (H) 08/24/2018   HGB 16.7 08/24/2018   HCT 48.3 08/24/2018   MCV 90.1 08/24/2018   PLT 257 08/24/2018   Lab Results  Component Value Date   INR 0.95 08/23/2018   BMET Lab Results  Component Value Date   NA 137 08/24/2018   K 4.6 08/24/2018   CL 101 08/24/2018   CO2 24 08/24/2018   GLUCOSE 149 (H) 08/24/2018   BUN 23 (H) 08/24/2018   CREATININE 1.02 08/24/2018   CALCIUM 8.8 (L) 08/24/2018    Studies/Results: Dg Chest Port 1 View  Result Date: 08/23/2018 CLINICAL DATA:  Herniated disc, pinched nerve, severe pain, preoperative evaluation EXAM: PORTABLE CHEST 1 VIEW COMPARISON:  Portable exam 1744 hours without priors for comparison FINDINGS: Normal heart size, mediastinal contours, and pulmonary vascularity. Lungs clear. No pleural effusion or pneumothorax. Bones unremarkable. IMPRESSION: No acute abnormalities. Electronically Signed   By: Ulyses Southward M.D.   On: 08/23/2018 17:54    Assessment/Plan: Patient stable, will plan for a microdisk tomorrow.   LOS: 1 day    Tiana Loft Talor Cheema 08/24/2018, 8:20 AM

## 2018-08-24 NOTE — Progress Notes (Signed)
Kyle Singh is a 53 y.o. male patient admitted from ED awake, alert - oriented  X 4 .  VSS - Blood pressure (!) 154/83, pulse 67, temperature (!) 97.5 F (36.4 C), temperature source Oral, resp. rate 16, height 5\' 8"  (1.727 m), weight 75 kg, SpO2 97 %.    IV in place, occlusive dsg intact without redness.  Orientation to room, and floor completed patient. Admission INP armband ID verified with patient/family, and in place.  SR up x 2, fall assessment complete, with patient able to verbalize understanding of risk associated with falls, and verbalized understanding to call nsg before up out of bed.  Call light within reach, patient able to voice, and demonstrate understanding.  Skin, clean-dry- intact without evidence of bruising, or skin tears.   No evidence of skin break down noted on exam.     Will cont to eval and treat per MD orders.  Dierdre Forth, RN 08/24/2018 1:48 AM

## 2018-08-24 NOTE — Progress Notes (Signed)
PROGRESS NOTE                                                                                                                                                                                                             Patient Demographics:    Kyle Singh, is a 53 y.o. male, DOB - 02-05-1966, KZL:[id number]  Admit date - 08/23/2018   Admitting Physician Leroy Sea, MD  Outpatient Primary MD for the patient is Patient, No Pcp Per  LOS - 1  Chief Complaint  Patient presents with  . Back Pain       Brief Narrative   Kyle Singh  is a 53 y.o. male, with no previous medical history except occasional cocaine abuse, left excruciating acute onset low back pain on 08/18/2018, he denies any injuries to that site, he says that he was sleeping in a recliner and woke up with pain, he had an MRI in the ER that day which showed herniated disc at L4-L5 level, he was seen by neurosurgeon Dr. Yetta Barre and a surgical correction was offered but patient declined and went home.  He comes back today with worsening of his low back pain which is radiating to his right leg, he also complains of some numbness in the right side of his thigh and some weakness of his foot.  Denies any bowel or bladder incontinence.  Denies any paresthesias in his buttocks area.  In the ER blood work is still pending, neurosurgery was consulted and we were requested to admit for pain control.  Denies any fever chills, no other medical problems, denies any IV drug use ever.   Subjective:    Kyle Singh today has, No headache, No chest pain, No abdominal pain - No Nausea, No new weakness tingling or numbness, No Cough - SOB. +ve low back pain.   Assessment  & Plan :     1.  L4-L5 disc prolapse with low back pain which is excruciating, right foot weakness, right leg paresthesias.  Patient was offered surgical correction few days ago but he declined, now unable to stand or walk, in considerable discomfort, but currently  on IV steroids IV narcotics, continue gentle hydration plan is to take him to the OR for surgical correction on 08/25/2018 discussed the case with Dr. Yetta Barre.  2.  Reactive leukocytosis.  Likely reactive, no fevers, denies any IV drug use.  Monitor.  Chest x-ray stable.  Now exposure to steroids as well.  Will monitor.  3.  Occasional cocaine and marijuana abuse.  Last use 1 month ago.  Counseled to quit.  4.  HTN.  Likely pain related.  Placed on Norvasc along with PRN hydralazine.  Able.    Family Communication  : None  Code Status :  Full  Disposition Plan  :  TBD  Consults  :  N.Surg  Procedures  :    DVT Prophylaxis  :  SCDs    Lab Results  Component Value Date   PLT 257 08/24/2018    Diet :  Diet Order            Diet NPO time specified  Diet effective midnight        Diet regular Room service appropriate? Yes; Fluid consistency: Thin  Diet effective now               Inpatient Medications Scheduled Meds: . amLODipine  10 mg Oral Daily  . dexamethasone  10 mg Intravenous Q6H  . sodium chloride flush  3 mL Intravenous Q12H   Continuous Infusions: . lactated ringers 75 mL/hr at 08/24/18 0100  . sodium chloride     PRN Meds:.baclofen, bisacodyl, hydrALAZINE, HYDROmorphone (DILAUDID) injection, ibuprofen, oxyCODONE-acetaminophen  Antibiotics  :   Anti-infectives (From admission, onward)   None          Objective:   Vitals:   08/23/18 2032 08/24/18 0058 08/24/18 0110 08/24/18 0531  BP: (!) 141/83 (!) 143/83 (!) 154/83 131/84  Pulse: 67 68 67 70  Resp: 16 14 16 12   Temp:   (!) 97.5 F (36.4 C) 97.6 F (36.4 C)  TempSrc:   Oral Oral  SpO2: 97% 98% 97% 96%  Weight:   75 kg   Height:   5\' 8"  (1.727 m)     Wt Readings from Last 3 Encounters:  08/24/18 75 kg  08/18/18 76.2 kg  12/18/16 77.1 kg     Intake/Output Summary (Last 24 hours) at 08/24/2018 0906 Last data filed at 08/24/2018 0800 Gross per 24 hour  Intake 525 ml  Output 470 ml  Net  55 ml     Physical Exam  Awake Alert, Oriented X 3, strength is 5 x 5 in all 4 extremities except for right foot weakness in dorsiflexion, also numbness in L4-L5 dermatome in the right medial aspect of his thigh.  Unable to extend his x-rays right hip due to pain. .AT,PERRAL Supple Neck,No JVD, No cervical lymphadenopathy appriciated.  Symmetrical Chest wall movement, Good air movement bilaterally, CTAB RRR,No Gallops,Rubs or new Murmurs, No Parasternal Heave +ve B.Sounds, Abd Soft, No tenderness, No organomegaly appriciated, No rebound - guarding or rigidity. No Cyanosis, Clubbing or edema, No new Rash or bruise       Data Review:    CBC Recent Labs  Lab 08/23/18 1743 08/23/18 2005 08/24/18 0514  WBC 17.1* 14.5* 16.9*  HGB 16.2 16.3 16.7  HCT 46.4 49.1 48.3  PLT 218 227 257  MCV 89.7 90.8 90.1  MCH 31.3 30.1 31.2  MCHC 34.9 33.2 34.6  RDW 11.7 11.5 11.8    Chemistries  Recent Labs  Lab 08/23/18 1743 08/23/18 2005 08/24/18 0514  NA 138  --  137  K 4.0  --  4.6  CL 103  --  101  CO2 23  --  24  GLUCOSE 116*  --  149*  BUN 25*  --  23*  CREATININE 0.97 1.00 1.02  CALCIUM 8.7*  --  8.8*   ------------------------------------------------------------------------------------------------------------------ No results for  input(s): CHOL, HDL, LDLCALC, TRIG, CHOLHDL, LDLDIRECT in the last 72 hours.  No results found for: HGBA1C ------------------------------------------------------------------------------------------------------------------ No results for input(s): TSH, T4TOTAL, T3FREE, THYROIDAB in the last 72 hours.  Invalid input(s): FREET3 ------------------------------------------------------------------------------------------------------------------ No results for input(s): VITAMINB12, FOLATE, FERRITIN, TIBC, IRON, RETICCTPCT in the last 72 hours.  Coagulation profile Recent Labs  Lab 08/23/18 2005  INR 0.95    No results for input(s): DDIMER in the  last 72 hours.  Cardiac Enzymes No results for input(s): CKMB, TROPONINI, MYOGLOBIN in the last 168 hours.  Invalid input(s): CK ------------------------------------------------------------------------------------------------------------------ No results found for: BNP  Micro Results No results found for this or any previous visit (from the past 240 hour(s)).  Radiology Reports Dg Lumbar Spine Complete  Result Date: 08/18/2018 CLINICAL DATA:  Back pain.  Sciatica. EXAM: LUMBAR SPINE - COMPLETE 4+ VIEW COMPARISON:  No prior. FINDINGS: Paraspinal soft tissues are normal. Diffuse multilevel degenerative change. No acute bony abnormality identified. No evidence of fracture. Normal alignment. Pelvic calcifications consistent phleboliths. Air-filled loops of small large bowel noted consistent adynamic ileus. IMPRESSION: 1. Diffuse multilevel degenerative change. No acute bony abnormality identified. 2.  Air-filled loops of small large bowel suggesting adynamic ileus. Electronically Signed   By: Maisie Fus  Register   On: 08/18/2018 10:39   Mr Lumbar Spine Wo Contrast  Result Date: 08/18/2018 CLINICAL DATA:  Right-sided low back pain beginning 1 week ago, worsening last night. Pain radiates to the right leg. EXAM: MRI LUMBAR SPINE WITHOUT CONTRAST TECHNIQUE: Multiplanar, multisequence MR imaging of the lumbar spine was performed. No intravenous contrast was administered. COMPARISON:  Radiography same day FINDINGS: Segmentation:  5 lumbar type vertebral bodies. Alignment:  Straightening of the normal lumbar lordosis. Vertebrae:  No fracture or primary bone lesion. Conus medullaris and cauda equina: Conus extends to the L1 level. Conus and cauda equina appear normal. Paraspinal and other soft tissues: Negative Disc levels: T12-L1 is normal. L1-2: Minimal desiccation and bulging of the disc. No compressive stenosis. L2-3: Biforaminal disc bulges. No central canal stenosis. Mild foraminal narrowing without  visible neural compression. L3-4: Biforaminal disc bulges larger on the right than the left. No central canal stenosis. Mild to moderate foraminal narrowing without visible neural compression. L4-5: Right foraminal disc herniation likely to compress the right L4 nerve. Foraminal disc bulge on the left without visible neural compression. Bilateral lateral recess stenosis which could possibly affect either or both L5 nerves. L5-S1: Disc bulge more prominent towards the left. Mild facet hypertrophy. Narrowing of the subarticular lateral recesses and foramina left more than right. Some potential for neural compression on the left. IMPRESSION: The dominant and likely acute finding is a right foraminal disc herniation at L4-5 likely to compress the right L4 nerve. Also at that level, there is bilateral lateral recess stenosis and mild left foraminal narrowing. L2-3: Bilateral foraminal disc bulges but without visible neural compression. L3-4: Bilateral foraminal disc bulges right more than left, but without visible neural compression. L5-S1: Disc bulge more prominent towards the left. Facet hypertrophy. Narrowing of the subarticular lateral recesses and foramina left more than right. Some potential for neural compression at this level, particularly on the left. Electronically Signed   By: Paulina Fusi M.D.   On: 08/18/2018 16:20   Dg Chest Port 1 View  Result Date: 08/23/2018 CLINICAL DATA:  Herniated disc, pinched nerve, severe pain, preoperative evaluation EXAM: PORTABLE CHEST 1 VIEW COMPARISON:  Portable exam 1744 hours without priors for comparison FINDINGS: Normal heart size, mediastinal contours, and pulmonary  vascularity. Lungs clear. No pleural effusion or pneumothorax. Bones unremarkable. IMPRESSION: No acute abnormalities. Electronically Signed   By: Ulyses Southward M.D.   On: 08/23/2018 17:54    Time Spent in minutes  30   Susa Raring M.D on 08/24/2018 at 9:06 AM  To page go to [email or web address] - password  [facility]

## 2018-08-24 NOTE — Plan of Care (Signed)
  Problem: Clinical Measurements: Goal: Will remain free from infection Outcome: Progressing   Problem: Pain Managment: Goal: General experience of comfort will improve Outcome: Not Progressing   Problem: Safety: Goal: Ability to remain free from injury will improve Outcome: Progressing

## 2018-08-25 ENCOUNTER — Inpatient Hospital Stay (HOSPITAL_COMMUNITY): Payer: Self-pay

## 2018-08-25 ENCOUNTER — Encounter (HOSPITAL_COMMUNITY)
Admission: EM | Disposition: A | Discharge status: Home / Self Care | Payer: Self-pay | Source: Home / Self Care | Attending: Internal Medicine

## 2018-08-25 ENCOUNTER — Encounter (HOSPITAL_COMMUNITY): Payer: Self-pay | Admitting: General Practice

## 2018-08-25 ENCOUNTER — Inpatient Hospital Stay (HOSPITAL_COMMUNITY): Payer: Self-pay | Admitting: Certified Registered"

## 2018-08-25 DIAGNOSIS — Z9889 Other specified postprocedural states: Secondary | ICD-10-CM

## 2018-08-25 HISTORY — PX: LUMBAR LAMINECTOMY/DECOMPRESSION MICRODISCECTOMY: SHX5026

## 2018-08-25 LAB — SURGICAL PCR SCREEN
MRSA, PCR: NEGATIVE
Staphylococcus aureus: NEGATIVE

## 2018-08-25 SURGERY — LUMBAR LAMINECTOMY/DECOMPRESSION MICRODISCECTOMY 1 LEVEL
Anesthesia: General | Site: Back | Laterality: Right

## 2018-08-25 MED ORDER — PHENOL 1.4 % MT LIQD: 1.0000 | OROMUCOSAL | Status: DC | PRN

## 2018-08-25 MED ORDER — THROMBIN 5000 UNITS EX SOLR
CUTANEOUS | Status: AC
  Filled 2018-08-25: qty 10000

## 2018-08-25 MED ORDER — MIDAZOLAM HCL 2 MG/2ML IJ SOLN
INTRAMUSCULAR | Status: AC
  Filled 2018-08-25: qty 2

## 2018-08-25 MED ORDER — SUCCINYLCHOLINE CHLORIDE 200 MG/10ML IV SOSY
PREFILLED_SYRINGE | INTRAVENOUS | Status: DC | PRN
  Administered 2018-08-25: 200 mg via INTRAVENOUS

## 2018-08-25 MED ORDER — DEXAMETHASONE SODIUM PHOSPHATE 10 MG/ML IJ SOLN
INTRAMUSCULAR | Status: AC
  Filled 2018-08-25: qty 1

## 2018-08-25 MED ORDER — KETOROLAC TROMETHAMINE 30 MG/ML IJ SOLN
INTRAMUSCULAR | Status: AC
  Filled 2018-08-25: qty 1

## 2018-08-25 MED ORDER — ROCURONIUM BROMIDE 50 MG/5ML IV SOSY
PREFILLED_SYRINGE | INTRAVENOUS | Status: DC | PRN
  Administered 2018-08-25: 20 mg via INTRAVENOUS
  Administered 2018-08-25: 50 mg via INTRAVENOUS

## 2018-08-25 MED ORDER — FENTANYL CITRATE (PF) 250 MCG/5ML IJ SOLN
INTRAMUSCULAR | Status: AC
  Filled 2018-08-25: qty 5

## 2018-08-25 MED ORDER — SODIUM CHLORIDE 0.9% FLUSH: 3.0000 mL | INTRAVENOUS | Status: DC | PRN

## 2018-08-25 MED ORDER — SUGAMMADEX SODIUM 200 MG/2ML IV SOLN
INTRAVENOUS | Status: DC | PRN
  Administered 2018-08-25: 150 mg via INTRAVENOUS

## 2018-08-25 MED ORDER — SODIUM CHLORIDE 0.9% FLUSH
3.0000 mL | Freq: Two times a day (BID) | INTRAVENOUS | Status: DC
  Administered 2018-08-25: 3 mL via INTRAVENOUS

## 2018-08-25 MED ORDER — ONDANSETRON HCL 4 MG/2ML IJ SOLN
INTRAMUSCULAR | Status: AC
  Filled 2018-08-25: qty 2

## 2018-08-25 MED ORDER — ONDANSETRON HCL 4 MG/2ML IJ SOLN: 4.0000 mg | Freq: Four times a day (QID) | INTRAMUSCULAR | Status: DC | PRN

## 2018-08-25 MED ORDER — HYDROMORPHONE HCL 1 MG/ML IJ SOLN
0.5000 mg | INTRAMUSCULAR | Status: DC | PRN
  Administered 2018-08-25 – 2018-08-26 (×5): 0.5 mg via INTRAVENOUS
  Filled 2018-08-25 (×5): qty 0.5

## 2018-08-25 MED ORDER — METHOCARBAMOL 500 MG PO TABS
ORAL_TABLET | ORAL | Status: AC
  Filled 2018-08-25: qty 1

## 2018-08-25 MED ORDER — CHLORHEXIDINE GLUCONATE CLOTH 2 % EX PADS: 6.0000 | MEDICATED_PAD | Freq: Once | CUTANEOUS | Status: DC

## 2018-08-25 MED ORDER — FENTANYL CITRATE (PF) 100 MCG/2ML IJ SOLN
INTRAMUSCULAR | Status: AC
  Filled 2018-08-25: qty 2

## 2018-08-25 MED ORDER — SODIUM CHLORIDE 0.9 % IV SOLN: 250.0000 mL | INTRAVENOUS | Status: DC

## 2018-08-25 MED ORDER — LACTATED RINGERS IV SOLN
INTRAVENOUS | Status: DC
  Administered 2018-08-25 (×2): via INTRAVENOUS

## 2018-08-25 MED ORDER — MENTHOL 3 MG MT LOZG: 1.0000 | LOZENGE | OROMUCOSAL | Status: DC | PRN

## 2018-08-25 MED ORDER — SUCCINYLCHOLINE CHLORIDE 200 MG/10ML IV SOSY
PREFILLED_SYRINGE | INTRAVENOUS | Status: AC
  Filled 2018-08-25: qty 10

## 2018-08-25 MED ORDER — CEFAZOLIN SODIUM-DEXTROSE 2-4 GM/100ML-% IV SOLN
2.0000 g | Freq: Three times a day (TID) | INTRAVENOUS | Status: AC
  Administered 2018-08-25 – 2018-08-26 (×2): 2 g via INTRAVENOUS
  Filled 2018-08-25 (×2): qty 100

## 2018-08-25 MED ORDER — CEFAZOLIN SODIUM-DEXTROSE 2-4 GM/100ML-% IV SOLN
2.0000 g | INTRAVENOUS | Status: AC
  Administered 2018-08-25: 2 g via INTRAVENOUS

## 2018-08-25 MED ORDER — OXYCODONE HCL 5 MG PO TABS
ORAL_TABLET | ORAL | Status: AC
  Administered 2018-08-25: 5 mg
  Filled 2018-08-25: qty 1

## 2018-08-25 MED ORDER — SODIUM CHLORIDE 0.9 % IV SOLN
INTRAVENOUS | Status: DC | PRN
  Administered 2018-08-25: 17:00:00

## 2018-08-25 MED ORDER — ONDANSETRON HCL 4 MG PO TABS: 4.0000 mg | ORAL_TABLET | Freq: Four times a day (QID) | ORAL | Status: DC | PRN

## 2018-08-25 MED ORDER — MIDAZOLAM HCL 2 MG/2ML IJ SOLN
INTRAMUSCULAR | Status: DC | PRN
  Administered 2018-08-25: 2 mg via INTRAVENOUS

## 2018-08-25 MED ORDER — LIDOCAINE 2% (20 MG/ML) 5 ML SYRINGE
INTRAMUSCULAR | Status: AC
  Filled 2018-08-25: qty 5

## 2018-08-25 MED ORDER — CELECOXIB 200 MG PO CAPS
200.0000 mg | ORAL_CAPSULE | Freq: Two times a day (BID) | ORAL | Status: DC
  Administered 2018-08-25: 200 mg via ORAL
  Filled 2018-08-25 (×2): qty 1

## 2018-08-25 MED ORDER — ACETAMINOPHEN 650 MG RE SUPP: 650.0000 mg | RECTAL | Status: DC | PRN

## 2018-08-25 MED ORDER — BUPIVACAINE HCL (PF) 0.25 % IJ SOLN
INTRAMUSCULAR | Status: DC | PRN
  Administered 2018-08-25: 2 mL

## 2018-08-25 MED ORDER — ROCURONIUM BROMIDE 50 MG/5ML IV SOSY
PREFILLED_SYRINGE | INTRAVENOUS | Status: AC
  Filled 2018-08-25: qty 5

## 2018-08-25 MED ORDER — CEFAZOLIN SODIUM-DEXTROSE 2-4 GM/100ML-% IV SOLN
INTRAVENOUS | Status: AC
  Filled 2018-08-25: qty 100

## 2018-08-25 MED ORDER — FENTANYL CITRATE (PF) 100 MCG/2ML IJ SOLN
INTRAMUSCULAR | Status: DC | PRN
  Administered 2018-08-25 (×2): 50 ug via INTRAVENOUS
  Administered 2018-08-25: 100 ug via INTRAVENOUS
  Administered 2018-08-25: 50 ug via INTRAVENOUS
  Administered 2018-08-25: 100 ug via INTRAVENOUS

## 2018-08-25 MED ORDER — ACETAMINOPHEN 325 MG PO TABS: 650.0000 mg | ORAL_TABLET | ORAL | Status: DC | PRN

## 2018-08-25 MED ORDER — ONDANSETRON HCL 4 MG/2ML IJ SOLN
INTRAMUSCULAR | Status: DC | PRN
  Administered 2018-08-25: 4 mg via INTRAVENOUS

## 2018-08-25 MED ORDER — DEXAMETHASONE 4 MG PO TABS: 4.0000 mg | ORAL_TABLET | Freq: Four times a day (QID) | ORAL | Status: DC

## 2018-08-25 MED ORDER — OXYCODONE HCL 5 MG PO TABS
5.0000 mg | ORAL_TABLET | ORAL | Status: DC | PRN
  Administered 2018-08-25 – 2018-08-26 (×2): 5 mg via ORAL
  Filled 2018-08-25 (×2): qty 1

## 2018-08-25 MED ORDER — POTASSIUM CHLORIDE IN NACL 20-0.9 MEQ/L-% IV SOLN
INTRAVENOUS | Status: DC
  Administered 2018-08-25: 20:00:00 via INTRAVENOUS
  Filled 2018-08-25 (×2): qty 1000

## 2018-08-25 MED ORDER — FENTANYL CITRATE (PF) 100 MCG/2ML IJ SOLN: 25.0000 ug | INTRAMUSCULAR | Status: DC | PRN

## 2018-08-25 MED ORDER — BUPIVACAINE HCL (PF) 0.25 % IJ SOLN
INTRAMUSCULAR | Status: AC
  Filled 2018-08-25: qty 30

## 2018-08-25 MED ORDER — THROMBIN 5000 UNITS EX SOLR
OROMUCOSAL | Status: DC | PRN
  Administered 2018-08-25: 17:00:00 via TOPICAL

## 2018-08-25 MED ORDER — DEXAMETHASONE SODIUM PHOSPHATE 10 MG/ML IJ SOLN
10.0000 mg | INTRAMUSCULAR | Status: AC
  Administered 2018-08-25: 10 mg via INTRAVENOUS

## 2018-08-25 MED ORDER — 0.9 % SODIUM CHLORIDE (POUR BTL) OPTIME
TOPICAL | Status: DC | PRN
  Administered 2018-08-25: 1000 mL

## 2018-08-25 MED ORDER — METHOCARBAMOL 1000 MG/10ML IJ SOLN
500.0000 mg | Freq: Four times a day (QID) | INTRAVENOUS | Status: DC | PRN
  Filled 2018-08-25: qty 5

## 2018-08-25 MED ORDER — SENNA 8.6 MG PO TABS
1.0000 | ORAL_TABLET | Freq: Two times a day (BID) | ORAL | Status: DC
  Filled 2018-08-25: qty 1

## 2018-08-25 MED ORDER — PROPOFOL 10 MG/ML IV BOLUS
INTRAVENOUS | Status: DC | PRN
  Administered 2018-08-25: 150 mg via INTRAVENOUS

## 2018-08-25 MED ORDER — PROPOFOL 10 MG/ML IV BOLUS
INTRAVENOUS | Status: AC
  Filled 2018-08-25: qty 20

## 2018-08-25 MED ORDER — ACETAMINOPHEN 500 MG PO TABS: 1000.0000 mg | ORAL_TABLET | Freq: Once | ORAL | Status: DC

## 2018-08-25 MED ORDER — DEXAMETHASONE SODIUM PHOSPHATE 4 MG/ML IJ SOLN: 4.0000 mg | Freq: Four times a day (QID) | INTRAMUSCULAR | Status: DC

## 2018-08-25 MED ORDER — KETOROLAC TROMETHAMINE 30 MG/ML IJ SOLN
INTRAMUSCULAR | Status: DC | PRN
  Administered 2018-08-25: 30 mg via INTRAVENOUS

## 2018-08-25 MED ORDER — METHOCARBAMOL 500 MG PO TABS
500.0000 mg | ORAL_TABLET | Freq: Four times a day (QID) | ORAL | Status: DC | PRN
  Administered 2018-08-25: 500 mg via ORAL

## 2018-08-25 SURGICAL SUPPLY — 55 items
APL SKNCLS STERI-STRIP NONHPOA (GAUZE/BANDAGES/DRESSINGS) ×1
BAG DECANTER FOR FLEXI CONT (MISCELLANEOUS) ×3 IMPLANT
BENZOIN TINCTURE PRP APPL 2/3 (GAUZE/BANDAGES/DRESSINGS) ×3 IMPLANT
BUR MATCHSTICK NEURO 3.0 LAGG (BURR) ×3 IMPLANT
CANISTER SUCT 3000ML PPV (MISCELLANEOUS) ×3 IMPLANT
CARTRIDGE OIL MAESTRO DRILL (MISCELLANEOUS) ×1 IMPLANT
CLOSURE WOUND 1/2 X4 (GAUZE/BANDAGES/DRESSINGS) ×1
COVER WAND RF STERILE (DRAPES) ×1 IMPLANT
DIFFUSER DRILL AIR PNEUMATIC (MISCELLANEOUS) ×3 IMPLANT
DRAPE LAPAROTOMY 100X72X124 (DRAPES) ×3 IMPLANT
DRAPE MICROSCOPE LEICA (MISCELLANEOUS) ×3 IMPLANT
DRAPE POUCH INSTRU U-SHP 10X18 (DRAPES) ×1 IMPLANT
DRAPE SURG 17X23 STRL (DRAPES) ×3 IMPLANT
DRSG OPSITE POSTOP 3X4 (GAUZE/BANDAGES/DRESSINGS) ×2 IMPLANT
DURAPREP 26ML APPLICATOR (WOUND CARE) ×3 IMPLANT
ELECT REM PT RETURN 9FT ADLT (ELECTROSURGICAL) ×3
ELECTRODE REM PT RTRN 9FT ADLT (ELECTROSURGICAL) ×1 IMPLANT
GAUZE 4X4 16PLY RFD (DISPOSABLE) IMPLANT
GLOVE BIO SURGEON STRL SZ 6.5 (GLOVE) ×3 IMPLANT
GLOVE BIO SURGEON STRL SZ7 (GLOVE) ×2 IMPLANT
GLOVE BIO SURGEON STRL SZ7.5 (GLOVE) ×2 IMPLANT
GLOVE BIO SURGEON STRL SZ8 (GLOVE) ×3 IMPLANT
GLOVE BIO SURGEONS STRL SZ 6.5 (GLOVE) ×3
GLOVE BIOGEL PI IND STRL 6.5 (GLOVE) IMPLANT
GLOVE BIOGEL PI IND STRL 7.0 (GLOVE) ×3 IMPLANT
GLOVE BIOGEL PI IND STRL 7.5 (GLOVE) IMPLANT
GLOVE BIOGEL PI INDICATOR 6.5 (GLOVE) ×2
GLOVE BIOGEL PI INDICATOR 7.0 (GLOVE) ×6
GLOVE BIOGEL PI INDICATOR 7.5 (GLOVE) ×2
GOWN STRL REUS W/ TWL LRG LVL3 (GOWN DISPOSABLE) IMPLANT
GOWN STRL REUS W/ TWL XL LVL3 (GOWN DISPOSABLE) ×1 IMPLANT
GOWN STRL REUS W/TWL 2XL LVL3 (GOWN DISPOSABLE) IMPLANT
GOWN STRL REUS W/TWL LRG LVL3 (GOWN DISPOSABLE) ×9
GOWN STRL REUS W/TWL XL LVL3 (GOWN DISPOSABLE) ×3
HEMOSTAT POWDER KIT SURGIFOAM (HEMOSTASIS) ×2 IMPLANT
KIT BASIN OR (CUSTOM PROCEDURE TRAY) ×3 IMPLANT
KIT TURNOVER KIT B (KITS) ×3 IMPLANT
NDL HYPO 25X1 1.5 SAFETY (NEEDLE) ×1 IMPLANT
NDL SPNL 20GX3.5 QUINCKE YW (NEEDLE) IMPLANT
NEEDLE HYPO 25X1 1.5 SAFETY (NEEDLE) ×3 IMPLANT
NEEDLE SPNL 20GX3.5 QUINCKE YW (NEEDLE) ×3 IMPLANT
NS IRRIG 1000ML POUR BTL (IV SOLUTION) ×3 IMPLANT
OIL CARTRIDGE MAESTRO DRILL (MISCELLANEOUS) ×3
PACK LAMINECTOMY NEURO (CUSTOM PROCEDURE TRAY) ×3 IMPLANT
PAD ARMBOARD 7.5X6 YLW CONV (MISCELLANEOUS) ×9 IMPLANT
RUBBERBAND STERILE (MISCELLANEOUS) ×6 IMPLANT
SPONGE SURGIFOAM ABS GEL SZ50 (HEMOSTASIS) IMPLANT
STRIP CLOSURE SKIN 1/2X4 (GAUZE/BANDAGES/DRESSINGS) ×2 IMPLANT
SUT VIC AB 0 CT1 18XCR BRD8 (SUTURE) ×1 IMPLANT
SUT VIC AB 0 CT1 8-18 (SUTURE) ×3
SUT VIC AB 2-0 CP2 18 (SUTURE) ×3 IMPLANT
SUT VIC AB 3-0 SH 8-18 (SUTURE) ×5 IMPLANT
TOWEL GREEN STERILE (TOWEL DISPOSABLE) ×3 IMPLANT
TOWEL GREEN STERILE FF (TOWEL DISPOSABLE) ×1 IMPLANT
WATER STERILE IRR 1000ML POUR (IV SOLUTION) ×3 IMPLANT

## 2018-08-25 NOTE — Transfer of Care (Signed)
Immediate Anesthesia Transfer of Care Note  Patient: Kyle Singh  Procedure(s) Performed: Right Lumbar four-five Extraforminal Microdiskectomy (Right Back)  Patient Location: PACU  Anesthesia Type:General  Level of Consciousness: awake, alert  and oriented  Airway & Oxygen Therapy: Patient Spontanous Breathing  Post-op Assessment: Report given to RN and Patient moving all extremities X 4  Post vital signs: Reviewed and stable  Last Vitals:  Vitals Value Taken Time  BP 160/98 08/25/2018  5:34 PM  Temp    Pulse 71 08/25/2018  5:35 PM  Resp 8 08/25/2018  5:35 PM  SpO2 98 % 08/25/2018  5:35 PM  Vitals shown include unvalidated device data.  Last Pain:  Vitals:   08/25/18 1342  TempSrc: Oral  PainSc:       Patients Stated Pain Goal: 2 (08/25/18 0600)  Complications: No apparent anesthesia complications

## 2018-08-25 NOTE — Progress Notes (Signed)
Attempted to give report to short stay charge.

## 2018-08-25 NOTE — Progress Notes (Signed)
PROGRESS NOTE                                                                                                                                                                                                             Patient Demographics:    Kyle Singh, is a 53 y.o. male, DOB - 1966/06/29, ZOX:[id number]  Admit date - 08/23/2018   Admitting Physician Leroy Sea, MD  Outpatient Primary MD for the patient is Patient, No Pcp Per  LOS - 2  Chief Complaint  Patient presents with  . Back Pain       Brief Narrative   Kyle Singh  is a 53 y.o. male, with no previous medical history except occasional cocaine abuse, left excruciating acute onset low back pain on 08/18/2018, he denies any injuries to that site, he says that he was sleeping in a recliner and woke up with pain, he had an MRI in the ER that day which showed herniated disc at L4-L5 level, he was seen by neurosurgeon Dr. Yetta Barre and a surgical correction was offered but patient declined and went home.  He comes back today with worsening of his low back pain which is radiating to his right leg, he also complains of some numbness in the right side of his thigh and some weakness of his foot.  Denies any bowel or bladder incontinence.  Denies any paresthesias in his buttocks area.  In the ER blood work is still pending, neurosurgery was consulted and we were requested to admit for pain control.  Denies any fever chills, no other medical problems, denies any IV drug use ever.   Subjective:   Patient in bed, appears comfortable, denies any headache, no fever, no chest pain or pressure, no shortness of breath , no abdominal pain. No focal weakness. +ve low back pain radiating to his right leg.   Assessment  & Plan :     1.  L4-L5 disc prolapse with low back pain which is excruciating, right foot weakness, right leg paresthesias.  Patient was offered surgical correction few days ago but he declined, now unable to stand or  walk, in considerable discomfort, but currently on IV steroids IV narcotics, due for surgical correction on 08/25/2018.  Postop will be transferred to neurosurgery service, plan discussed with Dr. Yetta Barre.  2.  Reactive leukocytosis.  Likely reactive, no fevers, denies any IV drug use.  Monitor.  Chest x-ray stable.  Now exposure to steroids as well.  Will monitor.  3.  Occasional cocaine and marijuana abuse.  Last use 1 month ago.  Counseled to quit.  4.  HTN.  Likely pain related.  Placed on Norvasc along with PRN hydralazine.  Able.    Family Communication  : None  Code Status :  Full  Disposition Plan  :  TBD  Consults  :  N.Surg  Procedures  :    DVT Prophylaxis  :  SCDs    Lab Results  Component Value Date   PLT 257 08/24/2018    Diet :  Diet Order            Diet NPO time specified  Diet effective midnight               Inpatient Medications Scheduled Meds: . amLODipine  10 mg Oral Daily  . dexamethasone  10 mg Intravenous Q6H  . sodium chloride flush  3 mL Intravenous Q12H   Continuous Infusions: . sodium chloride     PRN Meds:.alum & mag hydroxide-simeth, baclofen, bisacodyl, hydrALAZINE, HYDROmorphone (DILAUDID) injection, ibuprofen, ondansetron (ZOFRAN) IV, oxyCODONE-acetaminophen  Antibiotics  :   Anti-infectives (From admission, onward)   None          Objective:   Vitals:   08/24/18 0531 08/24/18 1458 08/24/18 2102 08/25/18 0613  BP: 131/84 131/78 (!) 145/82 128/82  Pulse: 70 72 72 67  Resp: 12 15 16 16   Temp: 97.6 F (36.4 C) 97.8 F (36.6 C) 98 F (36.7 C) 97.6 F (36.4 C)  TempSrc: Oral Oral Oral Oral  SpO2: 96% 100% 97% 97%  Weight:      Height:        Wt Readings from Last 3 Encounters:  08/24/18 75 kg  08/18/18 76.2 kg  12/18/16 77.1 kg     Intake/Output Summary (Last 24 hours) at 08/25/2018 0858 Last data filed at 08/25/2018 0609 Gross per 24 hour  Intake 1933.85 ml  Output 1450 ml  Net 483.85 ml     Physical  Exam  Awake Alert, Oriented X 3, strength is 5 x 5 in all 4 extremities except for right foot weakness in dorsiflexion, also numbness in L4-L5 dermatome in the right medial aspect of his thigh.  Unable to extend his x-rays right hip due to pain. [place].AT,PERRAL Supple Neck,No JVD, No cervical lymphadenopathy appriciated.  Symmetrical Chest wall movement, Good air movement bilaterally, CTAB RRR,No Gallops, Rubs or new Murmurs, No Parasternal Heave +ve B.Sounds, Abd Soft, No tenderness, No organomegaly appriciated, No rebound - guarding or rigidity. No Cyanosis, Clubbing or edema, No new Rash or bruise     Data Review:    CBC Recent Labs  Lab 08/23/18 1743 08/23/18 2005 08/24/18 0514  WBC 17.1* 14.5* 16.9*  HGB 16.2 16.3 16.7  HCT 46.4 49.1 48.3  PLT 218 227 257  MCV 89.7 90.8 90.1  MCH 31.3 30.1 31.2  MCHC 34.9 33.2 34.6  RDW 11.7 11.5 11.8    Chemistries  Recent Labs  Lab 08/23/18 1743 08/23/18 2005 08/24/18 0514  NA 138  --  137  K 4.0  --  4.6  CL 103  --  101  CO2 23  --  24  GLUCOSE 116*  --  149*  BUN 25*  --  23*  CREATININE 0.97 1.00 1.02  CALCIUM 8.7*  --  8.8*   ------------------------------------------------------------------------------------------------------------------ No results for input(s): CHOL, HDL, LDLCALC, TRIG, CHOLHDL, LDLDIRECT in the last 72 hours.  No results found for: HGBA1C ------------------------------------------------------------------------------------------------------------------ No results for  input(s): TSH, T4TOTAL, T3FREE, THYROIDAB in the last 72 hours.  Invalid input(s): FREET3 ------------------------------------------------------------------------------------------------------------------ No results for input(s): VITAMINB12, FOLATE, FERRITIN, TIBC, IRON, RETICCTPCT in the last 72 hours.  Coagulation profile Recent Labs  Lab 08/23/18 2005  INR 0.95    No results for input(s): DDIMER in the last 72  hours.  Cardiac Enzymes No results for input(s): CKMB, TROPONINI, MYOGLOBIN in the last 168 hours.  Invalid input(s): CK ------------------------------------------------------------------------------------------------------------------ No results found for: BNP  Micro Results Recent Results (from the past 240 hour(s))  Surgical pcr screen     Status: None   Collection Time: 08/25/18  6:11 AM  Result Value Ref Range Status   MRSA, PCR NEGATIVE NEGATIVE Final   Staphylococcus aureus NEGATIVE NEGATIVE Final    Comment: (NOTE) The Xpert SA Assay (FDA approved for NASAL specimens in patients 51 years of age and older), is one component of a comprehensive surveillance program. It is not intended to diagnose infection nor to guide or monitor treatment. Performed at [facility], [address] [place], [place] [address]     Radiology Reports Dg Lumbar Spine Complete  Result Date: 08/18/2018 CLINICAL DATA:  Back pain.  Sciatica. EXAM: LUMBAR SPINE - COMPLETE 4+ VIEW COMPARISON:  No prior. FINDINGS: Paraspinal soft tissues are normal. Diffuse multilevel degenerative change. No acute bony abnormality identified. No evidence of fracture. Normal alignment. Pelvic calcifications consistent phleboliths. Air-filled loops of small large bowel noted consistent adynamic ileus. IMPRESSION: 1. Diffuse multilevel degenerative change. No acute bony abnormality identified. 2.  Air-filled loops of small large bowel suggesting adynamic ileus. Electronically Signed   By: Maisie Fushomas  Register   On: 08/18/2018 10:39   Mr Lumbar Spine Wo Contrast  Result Date: 08/18/2018 CLINICAL DATA:  Right-sided low back pain beginning 1 week ago, worsening last night. Pain radiates to the right leg. EXAM: MRI LUMBAR SPINE WITHOUT CONTRAST TECHNIQUE: Multiplanar, multisequence MR imaging of the lumbar spine was performed. No intravenous contrast was administered. COMPARISON:  Radiography same day FINDINGS: Segmentation:  5  lumbar type vertebral bodies. Alignment:  Straightening of the normal lumbar lordosis. Vertebrae:  No fracture or primary bone lesion. Conus medullaris and cauda equina: Conus extends to the L1 level. Conus and cauda equina appear normal. Paraspinal and other soft tissues: Negative Disc levels: T12-L1 is normal. L1-2: Minimal desiccation and bulging of the disc. No compressive stenosis. L2-3: Biforaminal disc bulges. No central canal stenosis. Mild foraminal narrowing without visible neural compression. L3-4: Biforaminal disc bulges larger on the right than the left. No central canal stenosis. Mild to moderate foraminal narrowing without visible neural compression. L4-5: Right foraminal disc herniation likely to compress the right L4 nerve. Foraminal disc bulge on the left without visible neural compression. Bilateral lateral recess stenosis which could possibly affect either or both L5 nerves. L5-S1: Disc bulge more prominent towards the left. Mild facet hypertrophy. Narrowing of the subarticular lateral recesses and foramina left more than right. Some potential for neural compression on the left. IMPRESSION: The dominant and likely acute finding is a right foraminal disc herniation at L4-5 likely to compress the right L4 nerve. Also at that level, there is bilateral lateral recess stenosis and mild left foraminal narrowing. L2-3: Bilateral foraminal disc bulges but without visible neural compression. L3-4: Bilateral foraminal disc bulges right more than left, but without visible neural compression. L5-S1: Disc bulge more prominent towards the left. Facet hypertrophy. Narrowing of the subarticular lateral recesses and foramina left more than right. Some potential for  neural compression at this level, particularly on the left. Electronically Signed   By: Paulina Fusi M.D.   On: 08/18/2018 16:20   Dg Chest Port 1 View  Result Date: 08/23/2018 CLINICAL DATA:  Herniated disc, pinched nerve, severe pain, preoperative  evaluation EXAM: PORTABLE CHEST 1 VIEW COMPARISON:  Portable exam 1744 hours without priors for comparison FINDINGS: Normal heart size, mediastinal contours, and pulmonary vascularity. Lungs clear. No pleural effusion or pneumothorax. Bones unremarkable. IMPRESSION: No acute abnormalities. Electronically Signed   By: Ulyses Southward M.D.   On: 08/23/2018 17:54    Time Spent in minutes  30   Susa Raring M.D on 08/25/2018 at 8:58 AM  To page go to [email or web address] - password [facility]

## 2018-08-25 NOTE — Op Note (Signed)
08/23/2018 - 08/25/2018  5:08 PM  PATIENT:  Kyle Singh  53 y.o. 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 Imaging showed herniated disc L4-5 on the right in the extraforaminal space. The patient tried conservative measures without relief. Pain was debilitating. Recommended right L4-5 extraforaminal microdiscectomy. Patient understood the risks, benefits, and alternatives and potential outcomes and wished to proceed.  PROCEDURE DETAILS: The patient was taken to the operating room and after induction of adequate generalized endotracheal anesthesia, the patient was rolled into the prone position on the Wilson frame and all pressure points were padded. The lumbar region was cleaned and then prepped with DuraPrep and draped in the usual sterile fashion. 5 cc of local anesthesia was injected and then a dorsal midline incision was made and carried down to the lumbo sacral fascia. The fascia was opened and the paraspinous musculature was taken down in a subperiosteal fashion to expose extraforaminal space at L4-5 on the right. Intraoperative x-ray confirmed my level, and then I used a combination of the high-speed drill and the Kerrison punches to perform a foraminotomies at L4-5 on the right.  The pars and superior part of the facet was drilled.  I drilled down to the superior facet and drilled through this to identify the underlying ligament.  This was opened and the  exiting L4 nerve root was identified.  We worked in the axilla of the nerve root found a large disc herniation with a superior free fragment.  This was incised and removed with a nerve hook and pituitary rongeurs.  Several fragments were removed especially from the lateral part of the disc space.  The nerve root was well decompressed.  I performed a thorough intradiscal discectomy with pituitary rongeurs and curettes, until I had a nice decompression of the nerve root and the midline. I then palpated with a coronary dilator along the nerve root and into the foramen to assure adequate decompression. I felt no more compression of the nerve root. I irrigated with saline solution containing bacitracin. Achieved hemostasis with bipolar cautery, lined the dura with Gelfoam, and then closed the fascia with 0 Vicryl. I closed the subcutaneous tissues with 2-0 Vicryl and the subcuticular tissues with 3-0 Vicryl. The skin was then closed with benzoin and Steri-Strips. The drapes were removed, a sterile dressing was applied. The patient was awakened from general anesthesia and transferred to the recovery room in stable condition. At the end of the procedure all sponge, needle and instrument counts were correct.    PLAN OF CARE: Admit to inpatient   PATIENT DISPOSITION:  PACU - hemodynamically stable.   Delay start of Pharmacological VTE agent (>24hrs) due to surgical blood loss or risk of bleeding:  yes

## 2018-08-25 NOTE — Progress Notes (Signed)
Patient seen and examined.  Continues to have severe right leg pain in an L4 distribution with weakness with 1 out of 5 strength in the quadricep.  He is ready to move forward with microdiscectomy.  I described the surgery to him in detail.  I have tried to answer his questions.  He understands the risks of the procedure include but are not noted to bleeding, infection, CSF leak, nerve injury, numbness, weakness, paralysis, loss of bowel bladder or sexual function, lack of relief of pain, worsening pain, vascular injury, further surgery, instability, and anesthesia risk including DVT pneumonia MI and death.  He agrees to proceed

## 2018-08-25 NOTE — Anesthesia Preprocedure Evaluation (Addendum)
Anesthesia Evaluation  Patient identified by MRN, date of birth, ID band Patient awake    Reviewed: Allergy & Precautions, NPO status , Patient's Chart, lab work & pertinent test results  Airway Mallampati: II  TM Distance: >3 FB Neck ROM: Full    Dental no notable dental hx. (+) Teeth Intact, Dental Advisory Given   Pulmonary neg pulmonary ROS, Current Smoker,    Pulmonary exam normal breath sounds clear to auscultation       Cardiovascular hypertension, negative cardio ROS Normal cardiovascular exam Rhythm:Regular Rate:Normal     Neuro/Psych negative neurological ROS  negative psych ROS   GI/Hepatic negative GI ROS, (+)     substance abuse (last cocaine use 3 weeks ago)  cocaine use and marijuana use,   Endo/Other  negative endocrine ROS  Renal/GU negative Renal ROS  negative genitourinary   Musculoskeletal negative musculoskeletal ROS (+)   Abdominal   Peds  Hematology negative hematology ROS (+)   Anesthesia Other Findings   Reproductive/Obstetrics                            Anesthesia Physical Anesthesia Plan  ASA: II  Anesthesia Plan: General   Post-op Pain Management:    Induction: Intravenous  PONV Risk Score and Plan: 1  Airway Management Planned: Oral ETT  Additional Equipment:   Intra-op Plan:   Post-operative Plan: Extubation in OR  Informed Consent: I have reviewed the patients History and Physical, chart, labs and discussed the procedure including the risks, benefits and alternatives for the proposed anesthesia with the patient or authorized representative who has indicated his/her understanding and acceptance.     Dental advisory given  Plan Discussed with: CRNA  Anesthesia Plan Comments:         Anesthesia Quick Evaluation

## 2018-08-25 NOTE — Anesthesia Procedure Notes (Signed)
Procedure Name: Intubation Date/Time: 08/25/2018 4:00 PM Performed by: Barrington Ellison, CRNA Pre-anesthesia Checklist: Patient identified, Emergency Drugs available, Suction available and Patient being monitored Patient Re-evaluated:Patient Re-evaluated prior to induction Oxygen Delivery Method: Circle System Utilized Preoxygenation: Pre-oxygenation with 100% oxygen Induction Type: IV induction Ventilation: Mask ventilation without difficulty Laryngoscope Size: Mac and 4 Grade View: Grade I Tube type: Oral Tube size: 7.5 mm Number of attempts: 1 Airway Equipment and Method: Stylet and Oral airway Placement Confirmation: ETT inserted through vocal cords under direct vision,  positive ETCO2 and breath sounds checked- equal and bilateral Secured at: 22 cm Tube secured with: Tape Dental Injury: Teeth and Oropharynx as per pre-operative assessment

## 2018-08-26 ENCOUNTER — Other Ambulatory Visit: Payer: Self-pay

## 2018-08-26 ENCOUNTER — Encounter (HOSPITAL_COMMUNITY): Payer: Self-pay | Admitting: Neurological Surgery

## 2018-08-26 ENCOUNTER — Observation Stay (HOSPITAL_COMMUNITY)
Admission: EM | Admit: 2018-08-26 | Discharge: 2018-08-29 | Disposition: A | Discharge status: Home / Self Care | Payer: Self-pay | Attending: Neurological Surgery | Admitting: Neurological Surgery

## 2018-08-26 DIAGNOSIS — G8918 Other acute postprocedural pain: Principal | ICD-10-CM | POA: Diagnosis present

## 2018-08-26 DIAGNOSIS — Z9889 Other specified postprocedural states: Secondary | ICD-10-CM

## 2018-08-26 DIAGNOSIS — I10 Essential (primary) hypertension: Secondary | ICD-10-CM | POA: Insufficient documentation

## 2018-08-26 DIAGNOSIS — M5459 Other low back pain: Secondary | ICD-10-CM

## 2018-08-26 DIAGNOSIS — M545 Low back pain: Secondary | ICD-10-CM | POA: Insufficient documentation

## 2018-08-26 DIAGNOSIS — F17210 Nicotine dependence, cigarettes, uncomplicated: Secondary | ICD-10-CM | POA: Insufficient documentation

## 2018-08-26 MED ORDER — HYDROCODONE-ACETAMINOPHEN 5-325 MG PO TABS: 1.0000 | ORAL_TABLET | Freq: Four times a day (QID) | ORAL | 0 refills | Status: DC | PRN

## 2018-08-26 MED ORDER — METHOCARBAMOL 500 MG PO TABS: 500.0000 mg | ORAL_TABLET | Freq: Four times a day (QID) | ORAL | 0 refills | Status: DC | PRN

## 2018-08-26 NOTE — Anesthesia Postprocedure Evaluation (Signed)
Anesthesia Post Note  Patient: Kyle Singh  Procedure(s) Performed: Right Lumbar four-five Extraforminal Microdiskectomy (Right Back)     Patient location during evaluation: PACU Anesthesia Type: General Level of consciousness: awake and alert Pain management: pain level controlled Vital Signs Assessment: post-procedure vital signs reviewed and stable Respiratory status: spontaneous breathing, nonlabored ventilation, respiratory function stable and patient connected to nasal cannula oxygen Cardiovascular status: blood pressure returned to baseline and stable Postop Assessment: no apparent nausea or vomiting Anesthetic complications: no    Last Vitals:  Vitals:   08/25/18 1831 08/25/18 2045  BP: (!) 147/91 (!) 142/88  Pulse: 65 66  Resp: 17 18  Temp: 36.6 C 36.6 C  SpO2: 100% 96%    Last Pain:  Vitals:   08/26/18 0854  TempSrc:   PainSc: 7                  Pahoua Schreiner L Milik Gilreath

## 2018-08-26 NOTE — Discharge Summary (Signed)
Physician Discharge Summary  Patient ID: Kyle Singh MRN: [id number] DOB/AGE: 51-Aug-1967 51 y.o.  Admit date: 08/23/2018 Discharge date: 08/26/2018  Admission Diagnoses: HNP L4-5    Discharge Diagnoses: same   Discharged Condition: good  Hospital Course: The patient was admitted on 08/23/2018 and taken to the operating room where the patient underwent R L4-5 microdiskectomy. The patient tolerated the procedure well and was taken to the recovery room and then to the floor in stable condition. The hospital course was routine. There were no complications. The wound remained clean dry and intact. Pt had appropriate back soreness. No complaints of leg pain or new N/T/W. The patient remained afebrile with stable vital signs, and tolerated a regular diet. The patient continued to increase activities, and pain was well controlled with oral pain medications.   Consults: None  Significant Diagnostic Studies:  Results for orders placed or performed during the hospital encounter of 08/23/18  Surgical pcr screen  Result Value Ref Range   MRSA, PCR NEGATIVE NEGATIVE   Staphylococcus aureus NEGATIVE NEGATIVE  CBC  Result Value Ref Range   WBC 17.1 (H) 4.0 - 10.5 K/uL   RBC 5.17 4.22 - 5.81 MIL/uL   Hemoglobin 16.2 13.0 - 17.0 g/dL   HCT 12.4 58.0 - 99.8 %   MCV 89.7 80.0 - 100.0 fL   MCH 31.3 26.0 - 34.0 pg   MCHC 34.9 30.0 - 36.0 g/dL   RDW 33.8 25.0 - 53.9 %   Platelets 218 150 - 400 K/uL   nRBC 0.0 0.0 - 0.2 %  Basic metabolic panel  Result Value Ref Range   Sodium 138 135 - 145 mmol/L   Potassium 4.0 3.5 - 5.1 mmol/L   Chloride 103 98 - 111 mmol/L   CO2 23 22 - 32 mmol/L   Glucose, Bld 116 (H) 70 - 99 mg/dL   BUN 25 (H) 6 - 20 mg/dL   Creatinine, Ser 7.67 0.61 - 1.24 mg/dL   Calcium 8.7 (L) 8.9 - 10.3 mg/dL   GFR calc non Af Amer >60 >60 mL/min   GFR calc Af Amer >60 >60 mL/min   Anion gap 12 5 - 15  HIV antibody (Routine Testing)  Result Value Ref Range   HIV Screen 4th  Generation wRfx Non Reactive Non Reactive  CBC  Result Value Ref Range   WBC 14.5 (H) 4.0 - 10.5 K/uL   RBC 5.41 4.22 - 5.81 MIL/uL   Hemoglobin 16.3 13.0 - 17.0 g/dL   HCT 34.1 93.7 - 90.2 %   MCV 90.8 80.0 - 100.0 fL   MCH 30.1 26.0 - 34.0 pg   MCHC 33.2 30.0 - 36.0 g/dL   RDW 40.9 73.5 - 32.9 %   Platelets 227 150 - 400 K/uL   nRBC 0.0 0.0 - 0.2 %  Creatinine, serum  Result Value Ref Range   Creatinine, Ser 1.00 0.61 - 1.24 mg/dL   GFR calc non Af Amer >60 >60 mL/min   GFR calc Af Amer >60 >60 mL/min  Protime-INR  Result Value Ref Range   Prothrombin Time 12.6 11.4 - 15.2 seconds   INR 0.95   CBC  Result Value Ref Range   WBC 16.9 (H) 4.0 - 10.5 K/uL   RBC 5.36 4.22 - 5.81 MIL/uL   Hemoglobin 16.7 13.0 - 17.0 g/dL   HCT 92.4 26.8 - 34.1 %   MCV 90.1 80.0 - 100.0 fL   MCH 31.2 26.0 - 34.0 pg   MCHC 34.6  30.0 - 36.0 g/dL   RDW 37.9 02.4 - 09.7 %   Platelets 257 150 - 400 K/uL   nRBC 0.0 0.0 - 0.2 %  Basic metabolic panel  Result Value Ref Range   Sodium 137 135 - 145 mmol/L   Potassium 4.6 3.5 - 5.1 mmol/L   Chloride 101 98 - 111 mmol/L   CO2 24 22 - 32 mmol/L   Glucose, Bld 149 (H) 70 - 99 mg/dL   BUN 23 (H) 6 - 20 mg/dL   Creatinine, Ser 3.53 0.61 - 1.24 mg/dL   Calcium 8.8 (L) 8.9 - 10.3 mg/dL   GFR calc non Af Amer >60 >60 mL/min   GFR calc Af Amer >60 >60 mL/min   Anion gap 12 5 - 15  Urine rapid drug screen (hosp performed)  Result Value Ref Range   Opiates POSITIVE (A) NONE DETECTED   Cocaine NONE DETECTED NONE DETECTED   Benzodiazepines NONE DETECTED NONE DETECTED   Amphetamines NONE DETECTED NONE DETECTED   Tetrahydrocannabinol POSITIVE (A) NONE DETECTED   Barbiturates NONE DETECTED NONE DETECTED    Dg Lumbar Spine 2-3 Views  Result Date: 08/25/2018 CLINICAL DATA:  Right Lumbar four-five Extraforminal Microdiskectomy. EXAM: LUMBAR SPINE - 2-3 VIEW COMPARISON:  MRI lumbar spine and lumbar spine radiographs 08/18/2018. FINDINGS: Portable cross-table  lateral views of the lumbar spine are obtained for localization purposes. Lumbar vertebra are numbered on the images assuming 5 lumbar type vertebrae. Image labeled number 1 demonstrates localization marker projected over the posterior elements of L5. Image labeled number 2 demonstrates a localization marker projected over the posterior elements of L4. IMPRESSION: Cross-table lateral views of the lumbar spine obtained for localization purposes. Electronically Signed   By: Burman Nieves M.D.   On: 08/25/2018 19:27   Dg Lumbar Spine Complete  Result Date: 08/18/2018 CLINICAL DATA:  Back pain.  Sciatica. EXAM: LUMBAR SPINE - COMPLETE 4+ VIEW COMPARISON:  No prior. FINDINGS: Paraspinal soft tissues are normal. Diffuse multilevel degenerative change. No acute bony abnormality identified. No evidence of fracture. Normal alignment. Pelvic calcifications consistent phleboliths. Air-filled loops of small large bowel noted consistent adynamic ileus. IMPRESSION: 1. Diffuse multilevel degenerative change. No acute bony abnormality identified. 2.  Air-filled loops of small large bowel suggesting adynamic ileus. Electronically Signed   By: Maisie Fus  Register   On: 08/18/2018 10:39   Mr Lumbar Spine Wo Contrast  Result Date: 08/18/2018 CLINICAL DATA:  Right-sided low back pain beginning 1 week ago, worsening last night. Pain radiates to the right leg. EXAM: MRI LUMBAR SPINE WITHOUT CONTRAST TECHNIQUE: Multiplanar, multisequence MR imaging of the lumbar spine was performed. No intravenous contrast was administered. COMPARISON:  Radiography same day FINDINGS: Segmentation:  5 lumbar type vertebral bodies. Alignment:  Straightening of the normal lumbar lordosis. Vertebrae:  No fracture or primary bone lesion. Conus medullaris and cauda equina: Conus extends to the L1 level. Conus and cauda equina appear normal. Paraspinal and other soft tissues: Negative Disc levels: T12-L1 is normal. L1-2: Minimal desiccation and bulging of  the disc. No compressive stenosis. L2-3: Biforaminal disc bulges. No central canal stenosis. Mild foraminal narrowing without visible neural compression. L3-4: Biforaminal disc bulges larger on the right than the left. No central canal stenosis. Mild to moderate foraminal narrowing without visible neural compression. L4-5: Right foraminal disc herniation likely to compress the right L4 nerve. Foraminal disc bulge on the left without visible neural compression. Bilateral lateral recess stenosis which could possibly affect either or both L5 nerves. L5-S1:  Disc bulge more prominent towards the left. Mild facet hypertrophy. Narrowing of the subarticular lateral recesses and foramina left more than right. Some potential for neural compression on the left. IMPRESSION: The dominant and likely acute finding is a right foraminal disc herniation at L4-5 likely to compress the right L4 nerve. Also at that level, there is bilateral lateral recess stenosis and mild left foraminal narrowing. L2-3: Bilateral foraminal disc bulges but without visible neural compression. L3-4: Bilateral foraminal disc bulges right more than left, but without visible neural compression. L5-S1: Disc bulge more prominent towards the left. Facet hypertrophy. Narrowing of the subarticular lateral recesses and foramina left more than right. Some potential for neural compression at this level, particularly on the left. Electronically Signed   By: Paulina FusiMark  Shogry M.D.   On: 08/18/2018 16:20   Dg Chest Port 1 View  Result Date: 08/23/2018 CLINICAL DATA:  Herniated disc, pinched nerve, severe pain, preoperative evaluation EXAM: PORTABLE CHEST 1 VIEW COMPARISON:  Portable exam 1744 hours without priors for comparison FINDINGS: Normal heart size, mediastinal contours, and pulmonary vascularity. Lungs clear. No pleural effusion or pneumothorax. Bones unremarkable. IMPRESSION: No acute abnormalities. Electronically Signed   By: Ulyses SouthwardMark  Boles M.D.   On: 08/23/2018  17:54    Antibiotics:  Anti-infectives (From admission, onward)   Start     Dose/Rate Route Frequency Ordered Stop   08/26/18 0600  ceFAZolin (ANCEF) IVPB 2g/100 mL premix     2 g 200 mL/hr over 30 Minutes Intravenous On call to O.R. 08/25/18 1439 08/25/18 1610   08/25/18 1900  ceFAZolin (ANCEF) IVPB 2g/100 mL premix     2 g 200 mL/hr over 30 Minutes Intravenous Every 8 hours 08/25/18 1859 08/26/18 0456   08/25/18 1659  bacitracin 50,000 Units in sodium chloride 0.9 % 500 mL irrigation  Status:  Discontinued       As needed 08/25/18 1700 08/25/18 1729   08/25/18 1444  ceFAZolin (ANCEF) 2-4 GM/100ML-% IVPB    Note to Pharmacy:  Aquilla HackerWalton, Susan   : cabinet override      08/25/18 1444 08/25/18 1610      Discharge Exam: Blood pressure (!) 142/88, pulse 66, temperature 97.8 F (36.6 C), resp. rate 18, height 5' 7.99" (1.727 m), weight 75 kg, SpO2 96 %. Neurologic: Grossly normal except improved R quad 2/5 Dressing dry  Discharge Medications:   Allergies as of 08/26/2018   No Known Allergies     Medication List    STOP taking these medications   oxyCODONE-acetaminophen 5-325 MG tablet Commonly known as:  PERCOCET/ROXICET   predniSONE 10 MG (21) Tbpk tablet Commonly known as:  STERAPRED UNI-PAK 21 TAB     TAKE these medications   HYDROcodone-acetaminophen 5-325 MG tablet Commonly known as:  NORCO/VICODIN Take 1 tablet by mouth every 6 (six) hours as needed.   methocarbamol 500 MG tablet Commonly known as:  ROBAXIN Take 1 tablet (500 mg total) by mouth every 6 (six) hours as needed for muscle spasms. What changed:    when to take this  reasons to take this            Durable Medical Equipment  (From admission, onward)         Start     Ordered   08/25/18 1900  DME Walker rolling  Once    Question:  Patient needs a walker to treat with the following condition  Answer:  Gait instability   08/25/18 1859  Disposition: home   Final Dx: L4-5  microdiskectomy  Discharge Instructions     Remove dressing in 72 hours   Complete by:  As directed    Call MD for:  difficulty breathing, headache or visual disturbances   Complete by:  As directed    Call MD for:  persistant nausea and vomiting   Complete by:  As directed    Call MD for:  redness, tenderness, or signs of infection (pain, swelling, redness, odor or green/yellow discharge around incision site)   Complete by:  As directed    Call MD for:  severe uncontrolled pain   Complete by:  As directed    Call MD for:  temperature >100.4   Complete by:  As directed    Diet - low sodium heart healthy   Complete by:  As directed    Increase activity slowly   Complete by:  As directed       Follow-up Information    Tia AlertJones, David S, MD. Schedule an appointment as soon as possible for a visit in 2 week(s).   Specialty:  Neurosurgery Contact information: [address] [place] [address] [phone]            Signed: Tia AlertDavid S Jones 08/26/2018, 8:01 AM

## 2018-08-26 NOTE — Evaluation (Signed)
Occupational Therapy Evaluation Patient Details Name: Kyle LiesKeith A Singh MRN: [id number] DOB: 08/31/1965 Today's Date: 08/26/2018    History of Present Illness s/p R L4-5 microdiskectomy   Clinical Impression   Pt admitted with the above diagnoses and presents with below problem list. Pt will benefit from continued acute OT to address the below listed deficits and maximize independence with basic ADLs prior to d/c home. PTA pt was independent with ADLs. Pt is currently mod I to min guard with LB ADLs and functional mobility. Pt internally distracted and very eager to d/c home. Gave handout on back precautions.      Follow Up Recommendations  No OT follow up;Supervision - Intermittent    Equipment Recommendations  None recommended by OT    Recommendations for Other Services       Precautions / Restrictions Precautions Precautions: Back Restrictions Weight Bearing Restrictions: No      Mobility Bed Mobility Overal bed mobility: Modified Independent                Transfers Overall transfer level: Modified independent Equipment used: Rolling walker (2 wheeled);None             General transfer comment: Stood quickly from EOB then asked for a walker before ambulating.    Balance Overall balance assessment: Mild deficits observed, not formally tested                                         ADL either performed or assessed with clinical judgement   ADL Overall ADL's : Needs assistance/impaired Eating/Feeding: Set up   Grooming: Set up;Min guard   Upper Body Bathing: Set up   Lower Body Bathing: Min guard   Upper Body Dressing : Set up   Lower Body Dressing: Min guard   Toilet Transfer: Supervision/safety   Toileting- Clothing Manipulation and Hygiene: Supervision/safety   Tub/ Shower Transfer: Min guard   Functional mobility during ADLs: Min guard General ADL Comments: Pt completed bed mobility, household distance functional mobility.  Gave back education handout.     Vision         Perception     Praxis      Pertinent Vitals/Pain Pain Assessment: 0-10 Pain Score: 5  Pain Location: back, RLE Pain Descriptors / Indicators: Sore Pain Intervention(s): Limited activity within patient's tolerance;Repositioned;Premedicated before session;Monitored during session     Hand Dominance     Extremity/Trunk Assessment Upper Extremity Assessment Upper Extremity Assessment: Overall WFL for tasks assessed           Communication Communication Communication: No difficulties   Cognition Arousal/Alertness: Awake/alert Behavior During Therapy: Flat affect;Impulsive;Anxious Overall Cognitive Status: Within Functional Limits for tasks assessed                                 General Comments: internally distracted. very eager to d/c. reporting dizziness from meds but walking halls including turns with increased speed.   General Comments  Male visitor present with him. they report his ride is out front and cannot wait.     Exercises     Shoulder Instructions      Home Living Family/patient expects to be discharged to:: Private residence Living Arrangements: Alone     Home Access: Stairs to enter Entrance Stairs-Number of Steps: 3   Home Layout: One level  Bathroom Shower/Tub: Dietitian: Crutches          Prior Functioning/Environment Level of Independence: Independent                 OT Problem List: Decreased knowledge of use of DME or AE;Decreased knowledge of precautions;Pain;Impaired balance (sitting and/or standing)      OT Treatment/Interventions:      OT Goals(Current goals can be found in the care plan section) Acute Rehab OT Goals Patient Stated Goal: leave ASAP OT Goal Formulation: With patient/family  OT Frequency:     Barriers to D/C:            Co-evaluation              AM-PAC OT "6 Clicks" Daily Activity      Outcome Measure Help from another person eating meals?: None Help from another person taking care of personal grooming?: None Help from another person toileting, which includes using toliet, bedpan, or urinal?: None Help from another person bathing (including washing, rinsing, drying)?: None Help from another person to put on and taking off regular upper body clothing?: None Help from another person to put on and taking off regular lower body clothing?: None 6 Click Score: 24   End of Session Equipment Utilized During Treatment: Rolling walker  Activity Tolerance: Patient limited by pain;Other (comment)(reported dizziness) Patient left: in bed;with call bell/phone within reach;with family/visitor present;with nursing/sitter in room  OT Visit Diagnosis: Unsteadiness on feet (R26.81);Pain                Time: 4268-3419 OT Time Calculation (min): 8 min Charges:  OT General Charges $OT Visit: 1 Visit OT Evaluation $OT Eval Low Complexity: 1 Low  Raynald Kemp, OT Acute Rehabilitation Services Pager: [phone] Office: [phone]   Pilar Grammes 08/26/2018, 9:30 AM

## 2018-08-26 NOTE — Progress Notes (Signed)
Pt given discharge instructions, prescriptions, and care notes. Pt verbalized understanding AEB no further questions or concerns at this time. IV was discontinued, no redness, pain, or swelling noted at this time. Pt left the floor via wheelchair with staff in stable condition. 

## 2018-08-26 NOTE — Progress Notes (Signed)
PROGRESS NOTE                                                                                                                                                                                                             Patient Demographics:    Kyle Singh, is a 53 y.o. male, DOB - May 16, 1966, ZOX:[id number]  Admit date - 08/23/2018   Admitting Physician Leroy Sea, MD  Outpatient Primary MD for the patient is Patient, No Pcp Per  LOS - 3  Chief Complaint  Patient presents with  . Back Pain       Brief Narrative   Kyle Singh  is a 53 y.o. male, with no previous medical history except occasional cocaine abuse, left excruciating acute onset low back pain on 08/18/2018, he denies any injuries to that site, he says that he was sleeping in a recliner and woke up with pain, he had an MRI in the ER that day which showed herniated disc at L4-L5 level, he was seen by neurosurgeon Dr. Yetta Barre and a surgical correction was offered but patient declined and went home.  He comes back today with worsening of his low back pain which is radiating to his right leg, he also complains of some numbness in the right side of his thigh and some weakness of his foot.  Denies any bowel or bladder incontinence.  Denies any paresthesias in his buttocks area.  In the ER blood work is still pending, neurosurgery was consulted and we were requested to admit for pain control.  Denies any fever chills, no other medical problems, denies any IV drug use ever.   Subjective:   Patient in bed, appears comfortable, denies any headache, no fever, no chest pain or pressure, no shortness of breath , no abdominal pain. No focal weakness.  Pain much improved after surgery.   Assessment  & Plan :     1.  L4-L5 disc prolapse with low back pain which is excruciating, right foot weakness, right leg paresthesias.  Patient was offered surgical correction few days ago but he declined, he was seen by neurosurgery again  and underwent surgical correction by Dr. Yetta Barre on 08/25/2018, postop doing great.  Care handed over to neurosurgery.  Likely to be discharged later today per neurosurgery.  2.  Reactive leukocytosis.  Likely reactive, no fevers, denies any IV drug use.  Monitor.  Chest x-ray stable.  Now exposure to steroids as well.  Twisted PCP monitor post discharge.  3.  Occasional cocaine and marijuana abuse.  Last use 1 month ago.  Counseled to quit.  4.  HTN.  Likely pain related.    PCP to monitor and placed on medications if needed outpatient.   Family Communication  : None  Code Status :  Full  Disposition Plan  :  TBD  Consults  :  N.Surg  Procedures  :    DVT Prophylaxis  :  SCDs    Lab Results  Component Value Date   PLT 257 08/24/2018    Diet :  Diet Order            Diet - low sodium heart healthy        Diet Heart Room service appropriate? Yes; Fluid consistency: Thin  Diet effective now               Inpatient Medications Scheduled Meds: . celecoxib  200 mg Oral Q12H  . dexamethasone  4 mg Intravenous Q6H   Or  . dexamethasone  4 mg Oral Q6H  . senna  1 tablet Oral BID  . sodium chloride flush  3 mL Intravenous Q12H   Continuous Infusions: . sodium chloride    . 0.9 % NaCl with KCl 20 mEq / L Stopped (08/26/18 0835)  . methocarbamol (ROBAXIN) IV     PRN Meds:.acetaminophen **OR** acetaminophen, HYDROmorphone (DILAUDID) injection, menthol-cetylpyridinium **OR** phenol, methocarbamol **OR** methocarbamol (ROBAXIN) IV, ondansetron **OR** ondansetron (ZOFRAN) IV, oxyCODONE, sodium chloride flush  Antibiotics  :   Anti-infectives (From admission, onward)   Start     Dose/Rate Route Frequency Ordered Stop   08/26/18 0600  ceFAZolin (ANCEF) IVPB 2g/100 mL premix     2 g 200 mL/hr over 30 Minutes Intravenous On call to O.R. 08/25/18 1439 08/25/18 1610   08/25/18 1900  ceFAZolin (ANCEF) IVPB 2g/100 mL premix     2 g 200 mL/hr over 30 Minutes Intravenous Every 8  hours 08/25/18 1859 08/26/18 0456   08/25/18 1659  bacitracin 50,000 Units in sodium chloride 0.9 % 500 mL irrigation  Status:  Discontinued       As needed 08/25/18 1700 08/25/18 1729   08/25/18 1444  ceFAZolin (ANCEF) 2-4 GM/100ML-% IVPB    Note to Pharmacy:  Aquilla HackerWalton, Susan   : cabinet override      08/25/18 1444 08/25/18 1610          Objective:   Vitals:   08/25/18 1749 08/25/18 1800 08/25/18 1831 08/25/18 2045  BP: (!) 150/106 (!) 142/83 (!) 147/91 (!) 142/88  Pulse: 71 66 65 66  Resp: 14 16 17 18   Temp:   97.8 F (36.6 C) 97.8 F (36.6 C)  TempSrc:      SpO2: 98% 97% 100% 96%  Weight:      Height:        Wt Readings from Last 3 Encounters:  08/25/18 75 kg  08/18/18 76.2 kg  12/18/16 77.1 kg     Intake/Output Summary (Last 24 hours) at 08/26/2018 1026 Last data filed at 08/26/2018 0600 Gross per 24 hour  Intake 1011.83 ml  Output 825 ml  Net 186.83 ml     Physical Exam  Awake Alert, Oriented X 3, strength is 5 x 5 in all 4 extremities except for right foot weakness in dorsiflexion, mild numbness in L4-L5 dermatome, back pain much better [place].AT,PERRAL Supple Neck,No JVD, No cervical lymphadenopathy appriciated.  Symmetrical Chest wall movement, Good air movement bilaterally, CTAB RRR,No Gallops, Rubs or new Murmurs, No Parasternal  Heave +ve B.Sounds, Abd Soft, No tenderness, No organomegaly appriciated, No rebound - guarding or rigidity. No Cyanosis, Clubbing or edema, No new Rash or bruise     Data Review:    CBC Recent Labs  Lab 08/23/18 1743 08/23/18 2005 08/24/18 0514  WBC 17.1* 14.5* 16.9*  HGB 16.2 16.3 16.7  HCT 46.4 49.1 48.3  PLT 218 227 257  MCV 89.7 90.8 90.1  MCH 31.3 30.1 31.2  MCHC 34.9 33.2 34.6  RDW 11.7 11.5 11.8    Chemistries  Recent Labs  Lab 08/23/18 1743 08/23/18 2005 08/24/18 0514  NA 138  --  137  K 4.0  --  4.6  CL 103  --  101  CO2 23  --  24  GLUCOSE 116*  --  149*  BUN 25*  --  23*  CREATININE 0.97 1.00  1.02  CALCIUM 8.7*  --  8.8*   ------------------------------------------------------------------------------------------------------------------ No results for input(s): CHOL, HDL, LDLCALC, TRIG, CHOLHDL, LDLDIRECT in the last 72 hours.  No results found for: HGBA1C ------------------------------------------------------------------------------------------------------------------ No results for input(s): TSH, T4TOTAL, T3FREE, THYROIDAB in the last 72 hours.  Invalid input(s): FREET3 ------------------------------------------------------------------------------------------------------------------ No results for input(s): VITAMINB12, FOLATE, FERRITIN, TIBC, IRON, RETICCTPCT in the last 72 hours.  Coagulation profile Recent Labs  Lab 08/23/18 2005  INR 0.95    No results for input(s): DDIMER in the last 72 hours.  Cardiac Enzymes No results for input(s): CKMB, TROPONINI, MYOGLOBIN in the last 168 hours.  Invalid input(s): CK ------------------------------------------------------------------------------------------------------------------ No results found for: BNP  Micro Results Recent Results (from the past 240 hour(s))  Surgical pcr screen     Status: None   Collection Time: 08/25/18  6:11 AM  Result Value Ref Range Status   MRSA, PCR NEGATIVE NEGATIVE Final   Staphylococcus aureus NEGATIVE NEGATIVE Final    Comment: (NOTE) The Xpert SA Assay (FDA approved for NASAL specimens in patients 81 years of age and older), is one component of a comprehensive surveillance program. It is not intended to diagnose infection nor to guide or monitor treatment. Performed at [facility], [address], [place], [place] [address]     Radiology Reports Dg Lumbar Spine 2-3 Views  Result Date: 08/25/2018 CLINICAL DATA:  Right Lumbar four-five Extraforminal Microdiskectomy. EXAM: LUMBAR SPINE - 2-3 VIEW COMPARISON:  MRI lumbar spine and lumbar spine radiographs 08/18/2018. FINDINGS:  Portable cross-table lateral views of the lumbar spine are obtained for localization purposes. Lumbar vertebra are numbered on the images assuming 5 lumbar type vertebrae. Image labeled number 1 demonstrates localization marker projected over the posterior elements of L5. Image labeled number 2 demonstrates a localization marker projected over the posterior elements of L4. IMPRESSION: Cross-table lateral views of the lumbar spine obtained for localization purposes. Electronically Signed   By: Burman Nieves M.D.   On: 08/25/2018 19:27   Dg Lumbar Spine Complete  Result Date: 08/18/2018 CLINICAL DATA:  Back pain.  Sciatica. EXAM: LUMBAR SPINE - COMPLETE 4+ VIEW COMPARISON:  No prior. FINDINGS: Paraspinal soft tissues are normal. Diffuse multilevel degenerative change. No acute bony abnormality identified. No evidence of fracture. Normal alignment. Pelvic calcifications consistent phleboliths. Air-filled loops of small large bowel noted consistent adynamic ileus. IMPRESSION: 1. Diffuse multilevel degenerative change. No acute bony abnormality identified. 2.  Air-filled loops of small large bowel suggesting adynamic ileus. Electronically Signed   By: Maisie Fus  Register   On: 08/18/2018 10:39   Mr Lumbar Spine Wo Contrast  Result Date: 08/18/2018 CLINICAL DATA:  Right-sided low back pain beginning 1 week ago, worsening last night. Pain radiates to the right leg. EXAM: MRI LUMBAR SPINE WITHOUT CONTRAST TECHNIQUE: Multiplanar, multisequence MR imaging of the lumbar spine was performed. No intravenous contrast was administered. COMPARISON:  Radiography same day FINDINGS: Segmentation:  5 lumbar type vertebral bodies. Alignment:  Straightening of the normal lumbar lordosis. Vertebrae:  No fracture or primary bone lesion. Conus medullaris and cauda equina: Conus extends to the L1 level. Conus and cauda equina appear normal. Paraspinal and other soft tissues: Negative Disc levels: T12-L1 is normal. L1-2: Minimal  desiccation and bulging of the disc. No compressive stenosis. L2-3: Biforaminal disc bulges. No central canal stenosis. Mild foraminal narrowing without visible neural compression. L3-4: Biforaminal disc bulges larger on the right than the left. No central canal stenosis. Mild to moderate foraminal narrowing without visible neural compression. L4-5: Right foraminal disc herniation likely to compress the right L4 nerve. Foraminal disc bulge on the left without visible neural compression. Bilateral lateral recess stenosis which could possibly affect either or both L5 nerves. L5-S1: Disc bulge more prominent towards the left. Mild facet hypertrophy. Narrowing of the subarticular lateral recesses and foramina left more than right. Some potential for neural compression on the left. IMPRESSION: The dominant and likely acute finding is a right foraminal disc herniation at L4-5 likely to compress the right L4 nerve. Also at that level, there is bilateral lateral recess stenosis and mild left foraminal narrowing. L2-3: Bilateral foraminal disc bulges but without visible neural compression. L3-4: Bilateral foraminal disc bulges right more than left, but without visible neural compression. L5-S1: Disc bulge more prominent towards the left. Facet hypertrophy. Narrowing of the subarticular lateral recesses and foramina left more than right. Some potential for neural compression at this level, particularly on the left. Electronically Signed   By: Paulina FusiMark  Shogry M.D.   On: 08/18/2018 16:20   Dg Chest Port 1 View  Result Date: 08/23/2018 CLINICAL DATA:  Herniated disc, pinched nerve, severe pain, preoperative evaluation EXAM: PORTABLE CHEST 1 VIEW COMPARISON:  Portable exam 1744 hours without priors for comparison FINDINGS: Normal heart size, mediastinal contours, and pulmonary vascularity. Lungs clear. No pleural effusion or pneumothorax. Bones unremarkable. IMPRESSION: No acute abnormalities. Electronically Signed   By: Ulyses SouthwardMark   Boles M.D.   On: 08/23/2018 17:54    Time Spent in minutes  30   Susa RaringPrashant Tambra Muller M.D on 08/26/2018 at 10:26 AM  To page go to [email or web address] - password [email or web address]

## 2018-08-26 NOTE — ED Triage Notes (Addendum)
Patient states that he was d/c'd today after having lower back surgery and his pain is now unbearable. Pt moans/groaning in triage. States that pain won't stop. States that he did not get the Robaxin filled because he didn't have the money. States that he feels like he is going to pass out.

## 2018-08-27 ENCOUNTER — Encounter (HOSPITAL_COMMUNITY): Payer: Self-pay | Admitting: General Practice

## 2018-08-27 DIAGNOSIS — G8918 Other acute postprocedural pain: Secondary | ICD-10-CM | POA: Diagnosis present

## 2018-08-27 MED ORDER — KETOROLAC TROMETHAMINE 30 MG/ML IJ SOLN
30.0000 mg | Freq: Once | INTRAMUSCULAR | Status: AC
  Administered 2018-08-27: 30 mg via INTRAVENOUS
  Filled 2018-08-27: qty 1

## 2018-08-27 MED ORDER — SODIUM CHLORIDE 0.9% FLUSH: 3.0000 mL | INTRAVENOUS | Status: DC | PRN

## 2018-08-27 MED ORDER — SENNOSIDES-DOCUSATE SODIUM 8.6-50 MG PO TABS: 1.0000 | ORAL_TABLET | Freq: Every evening | ORAL | Status: DC | PRN

## 2018-08-27 MED ORDER — HYDROMORPHONE HCL 1 MG/ML IJ SOLN
2.0000 mg | Freq: Once | INTRAMUSCULAR | Status: DC
  Filled 2018-08-27: qty 2

## 2018-08-27 MED ORDER — GABAPENTIN 300 MG PO CAPS
300.0000 mg | ORAL_CAPSULE | Freq: Three times a day (TID) | ORAL | Status: DC
  Administered 2018-08-27 – 2018-08-29 (×7): 300 mg via ORAL
  Filled 2018-08-27 (×7): qty 1

## 2018-08-27 MED ORDER — HYDROMORPHONE HCL 1 MG/ML IJ SOLN
1.0000 mg | INTRAMUSCULAR | Status: DC | PRN
  Administered 2018-08-27 (×2): 1 mg via INTRAVENOUS
  Filled 2018-08-27 (×2): qty 1

## 2018-08-27 MED ORDER — FLEET ENEMA 7-19 GM/118ML RE ENEM: 1.0000 | ENEMA | Freq: Once | RECTAL | Status: DC | PRN

## 2018-08-27 MED ORDER — ONDANSETRON HCL 4 MG PO TABS: 4.0000 mg | ORAL_TABLET | Freq: Four times a day (QID) | ORAL | Status: DC | PRN

## 2018-08-27 MED ORDER — KETOROLAC TROMETHAMINE 60 MG/2ML IM SOLN
60.0000 mg | Freq: Once | INTRAMUSCULAR | Status: DC
  Filled 2018-08-27: qty 2

## 2018-08-27 MED ORDER — METHOCARBAMOL 500 MG PO TABS
500.0000 mg | ORAL_TABLET | Freq: Four times a day (QID) | ORAL | Status: DC
  Administered 2018-08-27 – 2018-08-29 (×8): 500 mg via ORAL
  Filled 2018-08-27 (×8): qty 1

## 2018-08-27 MED ORDER — ZOLPIDEM TARTRATE 5 MG PO TABS: 5.0000 mg | ORAL_TABLET | Freq: Every evening | ORAL | Status: DC | PRN

## 2018-08-27 MED ORDER — ACETAMINOPHEN 650 MG RE SUPP: 650.0000 mg | Freq: Four times a day (QID) | RECTAL | Status: DC | PRN

## 2018-08-27 MED ORDER — OXYCODONE HCL 5 MG PO TABS
5.0000 mg | ORAL_TABLET | ORAL | Status: DC | PRN
  Administered 2018-08-27 – 2018-08-29 (×11): 10 mg via ORAL
  Filled 2018-08-27 (×12): qty 2

## 2018-08-27 MED ORDER — SODIUM CHLORIDE 0.9% FLUSH
3.0000 mL | Freq: Two times a day (BID) | INTRAVENOUS | Status: DC
  Administered 2018-08-27 – 2018-08-29 (×5): 3 mL via INTRAVENOUS

## 2018-08-27 MED ORDER — SODIUM CHLORIDE 0.9 % IV SOLN: 250.0000 mL | INTRAVENOUS | Status: DC | PRN

## 2018-08-27 MED ORDER — HYDROMORPHONE HCL 1 MG/ML IJ SOLN
1.0000 mg | Freq: Once | INTRAMUSCULAR | Status: AC
  Administered 2018-08-27: 1 mg via INTRAVENOUS
  Filled 2018-08-27: qty 1

## 2018-08-27 MED ORDER — DOCUSATE SODIUM 100 MG PO CAPS
100.0000 mg | ORAL_CAPSULE | Freq: Two times a day (BID) | ORAL | Status: DC
  Administered 2018-08-27 – 2018-08-29 (×3): 100 mg via ORAL
  Filled 2018-08-27 (×4): qty 1

## 2018-08-27 MED ORDER — SODIUM CHLORIDE 0.9 % IV SOLN
INTRAVENOUS | Status: DC
  Administered 2018-08-27: 04:00:00 via INTRAVENOUS

## 2018-08-27 MED ORDER — KETOROLAC TROMETHAMINE 30 MG/ML IJ SOLN
30.0000 mg | Freq: Four times a day (QID) | INTRAMUSCULAR | Status: AC
  Administered 2018-08-27 – 2018-08-28 (×5): 30 mg via INTRAVENOUS
  Filled 2018-08-27 (×5): qty 1

## 2018-08-27 MED ORDER — ONDANSETRON HCL 4 MG/2ML IJ SOLN: 4.0000 mg | Freq: Four times a day (QID) | INTRAMUSCULAR | Status: DC | PRN

## 2018-08-27 MED ORDER — ACETAMINOPHEN 325 MG PO TABS
650.0000 mg | ORAL_TABLET | Freq: Four times a day (QID) | ORAL | Status: DC | PRN
  Administered 2018-08-27 – 2018-08-28 (×2): 650 mg via ORAL
  Filled 2018-08-27 (×2): qty 2

## 2018-08-27 MED ORDER — BISACODYL 5 MG PO TBEC: 5.0000 mg | DELAYED_RELEASE_TABLET | Freq: Every day | ORAL | Status: DC | PRN

## 2018-08-27 MED ORDER — HYDROCODONE-ACETAMINOPHEN 5-325 MG PO TABS: 1.0000 | ORAL_TABLET | ORAL | Status: DC | PRN

## 2018-08-27 NOTE — Plan of Care (Signed)

## 2018-08-27 NOTE — ED Provider Notes (Signed)
[name] EMERGENCY DEPARTMENT Provider Note   CSN: [id number] Arrival date & time: 08/26/18  2308     History   Chief Complaint Chief Complaint  Patient presents with  . Pain Control    HPI Kyle Singh is a 53 y.o. male.  Patient is a 53 year old male with past medical history of hypertension and recent lumbar disc surgery.  He was discharged earlier today after Dr. Yetta Singh performed this procedure.  He states he was doing well, then developed severe pain after returning home.  He denies any new injury or trauma.  He denies any bowel or bladder complaints.  He denies any weakness or numbness.  He denies any fevers or chills.  He was given Vicodin upon discharge, however this has not helped.  The history is provided by the patient.    Past Medical History:  Diagnosis Date  . Hypertension     Patient Active Problem List   Diagnosis Date Noted  . S/P lumbar laminectomy 08/25/2018  . Cocaine abuse (HCC) 08/24/2018  . HTN (hypertension) 08/24/2018  . Back pain 08/23/2018    Past Surgical History:  Procedure Laterality Date  . LUMBAR LAMINECTOMY/DECOMPRESSION MICRODISCECTOMY Right 08/25/2018   Procedure: Right Lumbar four-five Extraforminal Microdiskectomy;  Surgeon: Kyle Alert, MD;  Location: [facility] OR;  Service: Neurosurgery;  Laterality: Right;  . NO PAST SURGERIES          Home Medications    Prior to Admission medications   Medication Sig Start Date End Date Taking? Authorizing Provider  HYDROcodone-acetaminophen (NORCO/VICODIN) 5-325 MG tablet Take 1 tablet by mouth every 6 (six) hours as needed. 08/26/18   Kyle Alert, MD  methocarbamol (ROBAXIN) 500 MG tablet Take 1 tablet (500 mg total) by mouth every 6 (six) hours as needed for muscle spasms. 08/26/18   Kyle Alert, MD    Family History No family history on file.  Social History Social History   Tobacco Use  . Smoking status: Current Every Day Smoker    Packs/day: 1.00    Years:  12.00    Pack years: 12.00    Types: Cigarettes  . Smokeless tobacco: Never Used  Substance Use Topics  . Alcohol use: No  . Drug use: Not Currently    Types: Cocaine, Marijuana     Allergies   Patient has no known allergies.   Review of Systems Review of Systems  All other systems reviewed and are negative.    Physical Exam Updated Vital Signs BP 131/87 (BP Location: Right Arm)   Pulse 65   Temp 97.8 F (36.6 C) (Oral)   Resp 17   Ht 5\' 8"  (1.727 m)   Wt 74.8 kg   SpO2 95%   BMI 25.09 kg/m   Physical Exam Vitals signs and nursing note reviewed.  Constitutional:      General: He is not in acute distress.    Appearance: He is well-developed. He is not diaphoretic.  HENT:     Head: Normocephalic and atraumatic.  Cardiovascular:     Heart sounds: No murmur. No friction rub.  Pulmonary:     Effort: Pulmonary effort is normal. No respiratory distress.  Musculoskeletal: Normal range of motion.     Comments: The surgical dressing is in place.  The surrounding skin appears unremarkable without erythema.  There is no purulent drainage from the wound.  There is a tiny amount of dried blood on the dressing.  Skin:    General: Skin is  warm and dry.  Neurological:     Mental Status: He is Singh and oriented to person, place, and time.     Coordination: Coordination normal.     Comments: Patient is able to move both feet and sensation is intact.  Reflexes are 1+ in the left patellar and Achilles tendons, and trace in the right.      ED Treatments / Results  Labs (all labs ordered are listed, but only abnormal results are displayed) Labs Reviewed - No data to display  EKG None  Radiology Dg Lumbar Spine 2-3 Views  Result Date: 08/25/2018 CLINICAL DATA:  Right Lumbar four-five Extraforminal Microdiskectomy. EXAM: LUMBAR SPINE - 2-3 VIEW COMPARISON:  MRI lumbar spine and lumbar spine radiographs 08/18/2018. FINDINGS: Portable cross-table lateral views of the lumbar  spine are obtained for localization purposes. Lumbar vertebra are numbered on the images assuming 5 lumbar type vertebrae. Image labeled number 1 demonstrates localization marker projected over the posterior elements of L5. Image labeled number 2 demonstrates a localization marker projected over the posterior elements of L4. IMPRESSION: Cross-table lateral views of the lumbar spine obtained for localization purposes. Electronically Signed   By: Burman NievesWilliam  Stevens M.D.   On: 08/25/2018 19:27    Procedures Procedures (including critical care time)  Medications Ordered in ED Medications  HYDROmorphone (DILAUDID) injection 2 mg (has no administration in time range)  ketorolac (TORADOL) injection 60 mg (has no administration in time range)     Initial Impression / Assessment and Plan / ED Course  I have reviewed the triage vital signs and the nursing notes.  Pertinent labs & imaging results that were available during my care of the patient were reviewed by me and considered in my medical decision making (see chart for details).  Patient presenting here with complaints of severe low back pain.  He recently underwent microdiscectomy of L4-5.  Patient feels as though that something is wrong in his back and his adamant about being readmitted to the hospital.  I did speak with neurosurgery who will readmit for pain control and further evaluation.  Final Clinical Impressions(s) / ED Diagnoses   Final diagnoses:  None    ED Discharge Orders    None       Kyle Lyonselo, Sonda Coppens, MD 08/27/18 [phone]

## 2018-08-27 NOTE — H&P (Signed)
Chief Complaint   Chief Complaint  Patient presents with  . Pain Control    HPI   HPI: Kyle Singh is a 53 y.o. male who presents with intractable lower back pain. He is s/p right L4-5 extraforaminal microdiscectomy by Dr Yetta BarreJones on 2/5. Pre op he had significant RLE pain and weakness. RLE pain resolved with surgery. He had an uncomplicated post operative course and was discharged the morning of 08/26/2018.  He was doing well the morning of discharge but started to notice progress low back pain.  He denies recurrent radicular pains. He took 2 vicodin without relief. He came to the ER and has been given 2mg  dilaudid and 60mg  toradol without relief. He states he is unable to tolerate steroids because of "diffuse body spasms". He has persistent RLE weakness particular in quad which was present preoperatively, but otherwise has not noted any new weakness. He denies bowel or bladder dysfunction.   Patient Active Problem List   Diagnosis Date Noted  . Post-op pain 08/27/2018  . S/P lumbar laminectomy 08/25/2018  . Cocaine abuse (HCC) 08/24/2018  . HTN (hypertension) 08/24/2018  . Back pain 08/23/2018    PMH: Past Medical History:  Diagnosis Date  . Hypertension     PSH: Past Surgical History:  Procedure Laterality Date  . LUMBAR LAMINECTOMY/DECOMPRESSION MICRODISCECTOMY Right 08/25/2018   Procedure: Right Lumbar four-five Extraforminal Microdiskectomy;  Surgeon: Tia AlertJones, David S, MD;  Location[facility] OR;  Service: Neurosurgery;  Laterality: Right;  . NO PAST SURGERIES      (Not in a hospital admission)   SH: Social History   Tobacco Use  . Smoking status: Current Every Day Smoker    Packs/day: 1.00    Years: 12.00    Pack years: 12.00    Types: Cigarettes  . Smokeless tobacco: Never Used  Substance Use Topics  . Alcohol use: No  . Drug use: Not Currently    Types: Cocaine, Marijuana    MEDS: Prior to Admission medications   Medication Sig Start Date End Date Taking?  Authorizing Provider  HYDROcodone-acetaminophen (NORCO/VICODIN) 5-325 MG tablet Take 1 tablet by mouth every 6 (six) hours as needed. 08/26/18  Yes Tia AlertJones, David S, MD  methocarbamol (ROBAXIN) 500 MG tablet Take 1 tablet (500 mg total) by mouth every 6 (six) hours as needed for muscle spasms. 08/26/18   Tia AlertJones, David S, MD    ALLERGY: No Known Allergies  Social History   Tobacco Use  . Smoking status: Current Every Day Smoker    Packs/day: 1.00    Years: 12.00    Pack years: 12.00    Types: Cigarettes  . Smokeless tobacco: Never Used  Substance Use Topics  . Alcohol use: No     No family history on file.   ROS   Review of Systems  Constitutional: Negative.   HENT: Negative.   Eyes: Negative.   Respiratory: Negative.   Cardiovascular: Negative.   Gastrointestinal: Negative.   Genitourinary: Negative.   Musculoskeletal: Positive for back pain and myalgias. Negative for falls, joint pain and neck pain.  Skin: Negative.   Neurological: Positive for focal weakness (right quad) and weakness. Negative for dizziness, sensory change, speech change, seizures, loss of consciousness and headaches.    Exam   Vitals:   08/27/18 0130 08/27/18 0200  BP: (!) 142/87 (!) 147/95  Pulse: 66 66  Resp:    Temp:    SpO2: 94% 95%   General appearance: WDWN, NAD Eyes: No scleral injection  Cardiovascular: Regular rate and rhythm without murmurs, rubs, gallops. No edema or variciosities. Distal pulses normal. Pulmonary: Effort normal, non-labored breathing Musculoskeletal:     Muscle tone upper extremities: Normal    Muscle tone lower extremities: Normal    Motor exam: 5/5 BUE. LE exam performed with patient laying down. LLE grossly normal. RLE distally normal. 2/5 right quad.  Neurological Mental Status:    - Patient is awake, alert, oriented to person, place, month, year, and situation    - Patient is able to give a clear and coherent history.    - No signs of aphasia or neglect Cranial  Nerves    - II: Visual Fields are full. PERRL    - III/IV/VI: EOMI without ptosis or diploplia.     - V: Facial sensation is grossly normal    - VII: Facial movement is symmetric.     - VIII: hearing is intact to voice    - X: Uvula elevates symmetrically    - XI: Shoulder shrug is symmetric.    - XII: tongue is midline without atrophy or fasciculations.  Sensory: Sensation grossly intact to LT  Incision: minimal dried blood on bandage. No swelling, redness, warmth, tenderness, drainage.   Results - Imaging/Labs   Results for orders placed or performed during the hospital encounter of 08/23/18 (from the past 48 hour(s))  Surgical pcr screen     Status: None   Collection Time: 08/25/18  6:11 AM  Result Value Ref Range   MRSA, PCR NEGATIVE NEGATIVE   Staphylococcus aureus NEGATIVE NEGATIVE    Comment: (NOTE) The Xpert SA Assay (FDA approved for NASAL specimens in patients 51 years of age and older), is one component of a comprehensive surveillance program. It is not intended to diagnose infection nor to guide or monitor treatment. Performed at [facility], [address] [place], [place] [address]     Dg Lumbar Spine 2-3 Views  Result Date: 08/25/2018 CLINICAL DATA:  Right Lumbar four-five Extraforminal Microdiskectomy. EXAM: LUMBAR SPINE - 2-3 VIEW COMPARISON:  MRI lumbar spine and lumbar spine radiographs 08/18/2018. FINDINGS: Portable cross-table lateral views of the lumbar spine are obtained for localization purposes. Lumbar vertebra are numbered on the images assuming 5 lumbar type vertebrae. Image labeled number 1 demonstrates localization marker projected over the posterior elements of L5. Image labeled number 2 demonstrates a localization marker projected over the posterior elements of L4. IMPRESSION: Cross-table lateral views of the lumbar spine obtained for localization purposes. Electronically Signed   By: Burman NievesWilliam  Stevens M.D.   On: 08/25/2018 19:27    Impression/Plan   53 y.o. male s/p left L4-5 extraforaminal micodiscectomy who presents with intractable lower back pain. He is neurologically at baseline with stable RLE weakness. Do not see a need to order an emergent MRI at present.  Will admit for pain control and advise Dr Yetta BarreJones in the morning about patient's readmission.

## 2018-08-27 NOTE — Progress Notes (Addendum)
Patient ID: Kyle Singh, male   DOB: [birth year], 52 y.o.   MRN: [id number] Patient now states that his back is somewhat sore but he has leg pain.  He states "whatever you do with fixed my pain, is different, it is gone" but then rubs his quadricep and states that he has aching severe leg pain.  I have questioned him about this and he has trouble explaining what he means.  I will add gabapentin and Toradol to his treatment regimen.  His description of his "allergy" to steroids is quite odd and that he states it caused pain from his "head to my rectum."  I suspect he does not have an allergy to steroids.  If we cannot get his pain to settle down then I would recommend MRI of the lumbar spine to rule out postoperative hematoma or recurrent disc herniation, but we must recognize that it would be very difficult to interpret an MRI of the extraforaminal space just 2 days after surgery.  I suspect this is early radiculitis but obviously could be hematoma or recurrent disc herniation

## 2018-08-27 NOTE — Social Work (Addendum)
CSW acknowledging consult, however there are no details in consult. Per RN note pt has difficulty obtaining medications, RN CM will address with pt.  CSW signing off. Please consult if any additional needs arise.  Doy HutchingIsabel H Giani Betzold, LCSWA [facility] Clinical Social Work [phone]

## 2018-08-27 NOTE — Care Management Note (Signed)
Case Management Note  Patient Details  Name: Kyle Singh MRN: [id number] Date of Birth: [birth year]  Subjective/Objective:  Pt s/p Right L4-5 extraforaminal microdiscectomy on 2/5; readmitted on 08/27/18 with unrelenting pain.  PTA, pt independent with ADLS.                  Action/Plan: Met with pt and girlfriend to discuss issues with medication affordability.  Pt states he has been out of work, and was unable to afford dc meds when discharged.  He is uninsured; Consulting civil engineer can provide [place] letter at discharge for medication assistance.  Copays will be $3 for each Rx.  Girlfriend inquiring about pt getting Medicaid; informed her that pt would have to be disabled for a year in order to qualify.  Information given to pt on how to apply for Medicaid, if he chooses to do so.  Please contact RN Case Manager upon discharge to provide [facility] letter to pt.  Expected Discharge Date:  08/28/18               Expected Discharge Plan:  Home/Self Care  In-House Referral:     Discharge planning Services  CM Consult, [place] [facility], Medication Assistance  Post Acute Care Choice:    Choice offered to:     DME Arranged:    DME Agency:     HH Arranged:    HH Agency:     Status of Service:  In process, will continue to follow  If discussed at Long Length of Stay Meetings, dates discussed:    Additional Comments:  Reinaldo Raddle, RN, BSN  Trauma/Neuro ICU Case Manager [phone]

## 2018-08-27 NOTE — Progress Notes (Signed)
Admitted to 4N05, from ED. Noted discharged from 5W, 08/26/2018 post microdiskectomy. Patient able to walk to bed with minimal assistance. Noted right leg weakness. Vitals collected. Patient refused SCD. Requesting pain medications moaning. PRN given. Requesting crackers and peanut butter and coke to drink. Verbalized problem refilling medications post discharge, social work\case management consult ordered.  Kyle Singh

## 2018-08-28 MED ORDER — DEXAMETHASONE 4 MG PO TABS
4.0000 mg | ORAL_TABLET | Freq: Three times a day (TID) | ORAL | Status: DC
  Administered 2018-08-28 – 2018-08-29 (×4): 4 mg via ORAL
  Filled 2018-08-28 (×4): qty 1

## 2018-08-28 NOTE — Progress Notes (Signed)
Subjective: Patient reports a bit better  Objective: Vital signs in last 24 hours: Temp:  [97.6 F (36.4 C)-98.1 F (36.7 C)] 97.8 F (36.6 C) (02/07 2300) Pulse Rate:  [61-86] 86 (02/07 2300) Resp:  [16-18] 16 (02/07 2300) BP: (134-155)/(78-95) 141/95 (02/07 2300) SpO2:  [95 %-98 %] 97 % (02/07 2300)  Intake/Output from previous day: 02/07 0701 - 02/08 0700 In: 723 [P.O.:720; I.V.:3] Out: -  Intake/Output this shift: No intake/output data recorded.  Physical Exam: Less pain into right quadriceps.  Is getting up and walking.  Lab Results: No results for input(s): WBC, HGB, HCT, PLT in the last 72 hours. BMET No results for input(s): NA, K, CL, CO2, GLUCOSE, BUN, CREATININE, CALCIUM in the last 72 hours.  Studies/Results: No results found.  Assessment/Plan: Pain is improved "a bit."  I will add decadron, continue gabapentin.  Patient wants to stay in hospital today.  Hold on repeat imaging.  Likely discharge in AM.    LOS: 0 days    Dorian Heckle, MD 08/28/2018, 7:18 AM

## 2018-08-28 NOTE — Plan of Care (Signed)

## 2018-08-29 MED ORDER — GABAPENTIN 300 MG PO CAPS: 300.0000 mg | ORAL_CAPSULE | Freq: Three times a day (TID) | ORAL | 2 refills | Status: DC

## 2018-08-29 MED ORDER — METHYLPREDNISOLONE 4 MG PO TBPK: ORAL_TABLET | ORAL | 0 refills | Status: DC

## 2018-08-29 MED ORDER — METHOCARBAMOL 500 MG PO TABS: 500.0000 mg | ORAL_TABLET | Freq: Four times a day (QID) | ORAL | 0 refills | Status: DC | PRN

## 2018-08-29 NOTE — Progress Notes (Signed)
  NEUROSURGERY PROGRESS NOTE   No issues overnight.  LBP manageable. Denies LE symptoms Ready for discharge  EXAM:  BP 139/90   Pulse 80   Temp 98.4 F (36.9 C) (Oral)   Resp 20   Ht 5\' 8"  (1.727 m)   Wt 74.8 kg   SpO2 98%   BMI 25.09 kg/m   Awake, alert, oriented  Speech fluent, appropriate  CN grossly intact  5/5 BUE/BLE  Incision cdi  PLAN Doing well Pain much improved Will d/c home

## 2018-08-29 NOTE — Plan of Care (Signed)

## 2018-08-29 NOTE — Discharge Summary (Signed)
  Physician Discharge Summary  Patient ID: [name] MRN: [id number] DOB/AGE: [birth year] 53 y.o.  Admit date: 08/26/2018 Discharge date: 08/29/2018  Admission Diagnoses:  Post op pain  Discharge Diagnoses:  Same Active Problems:   Post-op pain   Discharged Condition: Stable  Hospital Course:  Kyle Singh is a 53 y.o. male who was admitted 08/26/2018 for pain control due to intractable LBP after surgery. After starting decadron, he had drastic improvement in his pain. He felt comfortable with discharge.  At time of discharge, pain was well controlled, ambulating with Pt/OT, tolerating po, voiding normal.   Treatments: Surgery - none  Discharge Exam: Blood pressure 139/90, pulse 80, temperature 98.4 F (36.9 C), temperature source Oral, resp. rate 20, height 5\' 8"  (1.727 m), weight 74.8 kg, SpO2 98 %. Awake, alert, oriented Speech fluent, appropriate CN grossly intact 5/5 BUE/BLE Wound c/d/i  Disposition:    Allergies as of 08/29/2018   No Known Allergies     Medication List    TAKE these medications   gabapentin 300 MG capsule Commonly known as:  NEURONTIN Take 1 capsule (300 mg total) by mouth 3 (three) times daily.   HYDROcodone-acetaminophen 5-325 MG tablet Commonly known as:  NORCO/VICODIN Take 1 tablet by mouth every 6 (six) hours as needed.   methocarbamol 500 MG tablet Commonly known as:  ROBAXIN Take 1 tablet (500 mg total) by mouth every 6 (six) hours as needed for muscle spasms.   methylPREDNISolone 4 MG Tbpk tablet Commonly known as:  MEDROL Take according to package insert      Follow-up Information    Tia Alert, MD Follow up.   Specialty:  Neurosurgery Contact information: [address] [place] [address] [phone]           Signed: Alyson Ingles 08/29/2018, 10:01 AM

## 2018-09-01 ENCOUNTER — Other Ambulatory Visit: Payer: Self-pay

## 2018-09-01 ENCOUNTER — Emergency Department (HOSPITAL_COMMUNITY)
Admission: EM | Admit: 2018-09-01 | Discharge: 2018-09-01 | Disposition: A | Discharge status: Home / Self Care | Payer: Self-pay | Attending: Emergency Medicine | Admitting: Emergency Medicine

## 2018-09-01 ENCOUNTER — Encounter (HOSPITAL_COMMUNITY): Payer: Self-pay | Admitting: Emergency Medicine

## 2018-09-01 DIAGNOSIS — Z79899 Other long term (current) drug therapy: Secondary | ICD-10-CM | POA: Insufficient documentation

## 2018-09-01 DIAGNOSIS — I10 Essential (primary) hypertension: Secondary | ICD-10-CM | POA: Insufficient documentation

## 2018-09-01 DIAGNOSIS — M5441 Lumbago with sciatica, right side: Secondary | ICD-10-CM

## 2018-09-01 DIAGNOSIS — M545 Low back pain: Secondary | ICD-10-CM | POA: Insufficient documentation

## 2018-09-01 DIAGNOSIS — G8929 Other chronic pain: Secondary | ICD-10-CM

## 2018-09-01 DIAGNOSIS — F17210 Nicotine dependence, cigarettes, uncomplicated: Secondary | ICD-10-CM | POA: Insufficient documentation

## 2018-09-01 HISTORY — DX: Dorsalgia, unspecified: M54.9

## 2018-09-01 MED ORDER — METHOCARBAMOL 500 MG PO TABS
750.0000 mg | ORAL_TABLET | Freq: Once | ORAL | Status: AC
  Administered 2018-09-01: 750 mg via ORAL
  Filled 2018-09-01: qty 2

## 2018-09-01 MED ORDER — METHOCARBAMOL 500 MG PO TABS: 500.0000 mg | ORAL_TABLET | Freq: Four times a day (QID) | ORAL | 0 refills | Status: DC | PRN

## 2018-09-01 MED ORDER — DEXAMETHASONE 4 MG PO TABS
10.0000 mg | ORAL_TABLET | Freq: Once | ORAL | Status: AC
  Administered 2018-09-01: 10 mg via ORAL
  Filled 2018-09-01: qty 3

## 2018-09-01 MED ORDER — HYDROMORPHONE HCL 1 MG/ML IJ SOLN
1.0000 mg | Freq: Once | INTRAMUSCULAR | Status: AC
  Administered 2018-09-01: 1 mg via INTRAMUSCULAR
  Filled 2018-09-01: qty 1

## 2018-09-01 MED ORDER — GABAPENTIN 300 MG PO CAPS: 300.0000 mg | ORAL_CAPSULE | Freq: Three times a day (TID) | ORAL | 2 refills | Status: DC

## 2018-09-01 MED ORDER — DIAZEPAM 5 MG PO TABS
5.0000 mg | ORAL_TABLET | Freq: Once | ORAL | Status: AC
  Administered 2018-09-01: 5 mg via ORAL
  Filled 2018-09-01: qty 1

## 2018-09-01 MED ORDER — HYDROCODONE-ACETAMINOPHEN 5-325 MG PO TABS: 1.0000 | ORAL_TABLET | Freq: Four times a day (QID) | ORAL | 0 refills | Status: DC | PRN

## 2018-09-01 NOTE — ED Provider Notes (Signed)
[facility] EMERGENCY DEPARTMENT Provider Note   CSN: [id number] Arrival date & time: 09/01/18  2012     History   Chief Complaint Chief Complaint  Patient presents with  . Back Pain  . Leg Pain  . out of medication    HPI Kyle Singh is a 53 y.o. male.  Patient to ED with complaint of severe low back pain. He underwent right L4-5 extraforaminal microdiscectomy utilizing microscopic dissection by Dr. Yetta Barre on 08/25/18, discharged home on 08/26/18. He returned to the emergency department on 08/27/18 with intractable low back pain and was readmitted by neurosurgery for pain control. Per chart review, he was not found to have any new neurologic deficits. Postoperative hematoma or recurrent disc herniation were considerations, but pain improved and re-imaging was not felt necessary. He was discharged home on 08/29/18 with Rx's for Norco, gabapentin, Medrol dosepack and Robaxin. He had been aided by CM during admission who provided [facility] assistance form for $3 prescriptions. He returns today again with intractable back pain, stating "something is wrong". He denies any new symptoms. No fever, abdominal pain, urinary/bowel incontinence, saddle anesthesia.   The history is provided by the patient. No language interpreter was used.  Back Pain  Associated symptoms: leg pain   Associated symptoms: no abdominal pain, no fever and no numbness   Leg Pain  Associated symptoms: back pain   Associated symptoms: no fever     Past Medical History:  Diagnosis Date  . Back pain   . Hypertension     Patient Active Problem List   Diagnosis Date Noted  . Post-op pain 08/27/2018  . S/P lumbar laminectomy 08/25/2018  . Cocaine abuse (HCC) 08/24/2018  . HTN (hypertension) 08/24/2018  . Back pain 08/23/2018    Past Surgical History:  Procedure Laterality Date  . LUMBAR LAMINECTOMY/DECOMPRESSION MICRODISCECTOMY Right 08/25/2018   Procedure: Right Lumbar four-five Extraforminal  Microdiskectomy;  Surgeon: Tia Alert, MD;  Location: [facility] OR;  Service: Neurosurgery;  Laterality: Right;  . NO PAST SURGERIES          Home Medications    Prior to Admission medications   Medication Sig Start Date End Date Taking? Authorizing Provider  gabapentin (NEURONTIN) 300 MG capsule Take 1 capsule (300 mg total) by mouth 3 (three) times daily. 08/29/18   Costella, Darci Current, PA-C  HYDROcodone-acetaminophen (NORCO/VICODIN) 5-325 MG tablet Take 1 tablet by mouth every 6 (six) hours as needed. 08/26/18   Tia Alert, MD  methocarbamol (ROBAXIN) 500 MG tablet Take 1 tablet (500 mg total) by mouth every 6 (six) hours as needed for muscle spasms. 08/29/18   Costella, Darci Current, PA-C  methylPREDNISolone (MEDROL) 4 MG TBPK tablet Take according to package insert 08/29/18   Costella, Darci Current, PA-C    Family History No family history on file.  Social History Social History   Tobacco Use  . Smoking status: Current Every Day Smoker    Packs/day: 1.00    Years: 12.00    Pack years: 12.00    Types: Cigarettes  . Smokeless tobacco: Never Used  Substance Use Topics  . Alcohol use: No  . Drug use: Not Currently    Types: Cocaine, Marijuana     Allergies   Patient has no known allergies.   Review of Systems Review of Systems  Constitutional: Negative for chills and fever.  Respiratory: Negative.   Cardiovascular: Negative.   Gastrointestinal: Negative.  Negative for abdominal pain, nausea and vomiting.  Genitourinary: Negative  for enuresis.  Musculoskeletal: Positive for back pain.  Skin: Negative.   Neurological: Negative.  Negative for numbness.     Physical Exam Updated Vital Signs BP (!) 144/96 (BP Location: Right Arm)   Pulse 83   Temp 97.7 F (36.5 C) (Oral)   Resp (!) 22   SpO2 99%   Physical Exam Constitutional:      Appearance: He is well-developed.  Neck:     Musculoskeletal: Normal range of motion.  Pulmonary:     Effort: Pulmonary effort is  normal.  Musculoskeletal: Normal range of motion.     Comments: Surgical incision in lumbar spine is closed, no dehiscence, no surrounding redness or abnormal swelling.   Skin:    General: Skin is warm and dry.  Neurological:     Mental Status: He is alert and oriented to person, place, and time.     Comments: Unable to test, patient unable to participate secondary to pain.        ED Treatments / Results  Labs (all labs ordered are listed, but only abnormal results are displayed) Labs Reviewed - No data to display  EKG None  Radiology No results found.  Procedures Procedures (including critical care time)  Medications Ordered in ED Medications  HYDROmorphone (DILAUDID) injection 1 mg (1 mg Intramuscular Given 09/01/18 2243)  diazepam (VALIUM) tablet 5 mg (5 mg Oral Given 09/01/18 2243)     Initial Impression / Assessment and Plan / ED Course  I have reviewed the triage vital signs and the nursing notes.  Pertinent labs & imaging results that were available during my care of the patient were reviewed by me and considered in my medical decision making (see chart for details).     Patient to ED with intractable back pain after lumbar discectomy on 08/25/18.   Exam is difficult. Sitting up, crossed legged, not willing/able to move for exam. Will treat pain and reassess when neurologic exam can be performed reliably. Surgical incision unremarkable. Do no suspect localized infection.   Plan: will attempt pain control, get a focused neurologic exam and consult neurosurgery.  11:30 - recheck - pain improved. Patient able to lie flat and stretch his legs out. Has pain in RLE as before but full function. Slightly decreased patellar reflex, which has been previously documented. No new neurologic deficits identified.   He was unable to fill any Rx but the Norco on discharge from the hospital and is now out. He states he cannot afford the Rx's despite being given [facility] assistance.  Strongly encouraged to fill the prescriptions and reassured that his condition will improve if he complies with his medical plan of care.   Patient asking nursing if "I can just stay here and sleep". Strongly suspect supporting reason for being here is homelessness. He is given a meal. He appears improved in terms of back pain. No identified neurologic deficit. No concern for post-operative infection.   He is considered safe and appropriate for discharge home.   He is observed ambulatory up to the nursing desk asking about more medications. No difficulty weight bearing, no ataxia, no foot drop.    Final Clinical Impressions(s) / ED Diagnoses   Final diagnoses:  None   1. Low back pain  ED Discharge Orders    None       Danne Harbor 09/01/18 2358    Shaune Pollack, MD 09/02/18 1120

## 2018-09-01 NOTE — ED Triage Notes (Signed)
Pt to triage via [facility]>  Back surgery on 2/5.  C/o continued back pain and right leg pain.  Pt is out of Vicodin.  Took ibuprofen 4 hours ago without relief.  Has been unable to get muscle relaxer Rx filled.

## 2018-09-01 NOTE — ED Notes (Signed)
Pt given turkey sandwich and coke 

## 2018-09-01 NOTE — Discharge Instructions (Addendum)
Follow up with Dr. Yetta Barre as scheduled for further management of back problems.

## 2020-03-26 IMAGING — DX DG LUMBAR SPINE COMPLETE 4+V
5 series · 5 of 5 positions shown · non-contrast
Comparison: No prior.

CLINICAL DATA: Back pain.  Sciatica.

EXAM:
LUMBAR SPINE - COMPLETE 4+ VIEW

[t lumbar spine lat]
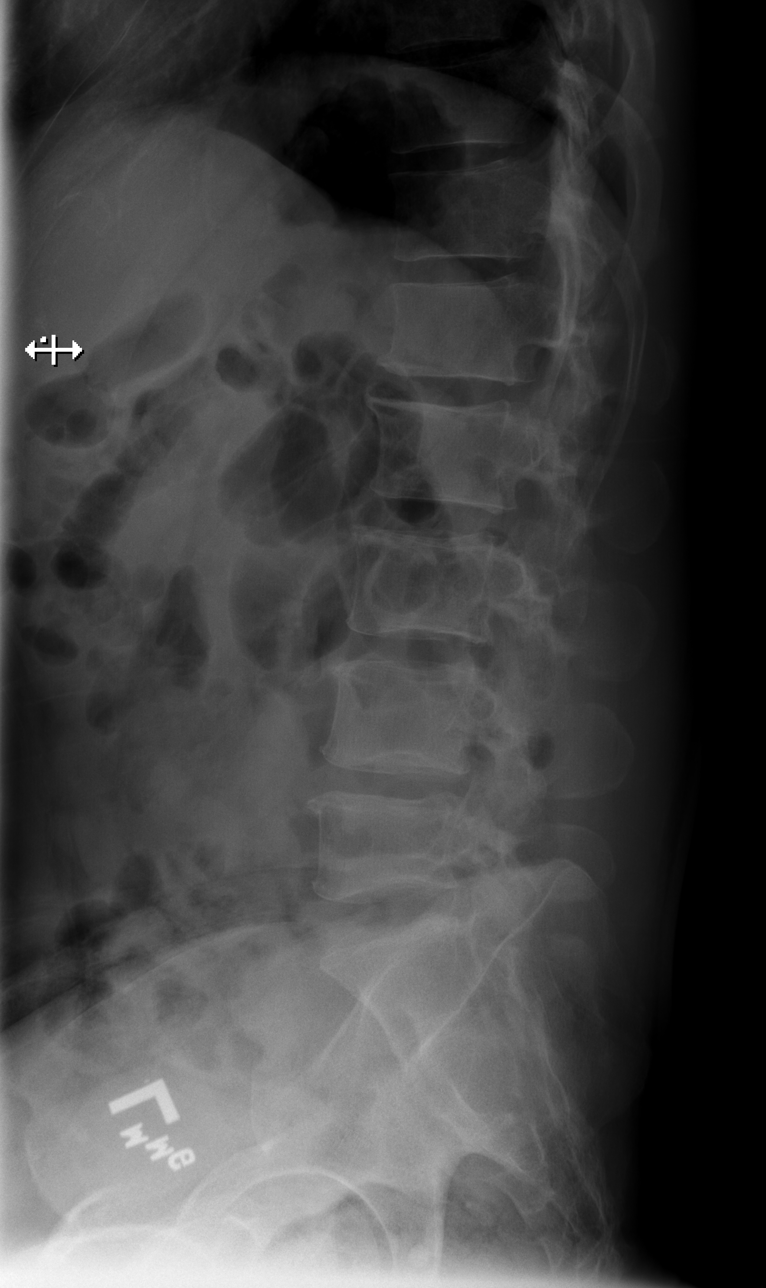

[t lumbar l-5 s-1 spot]
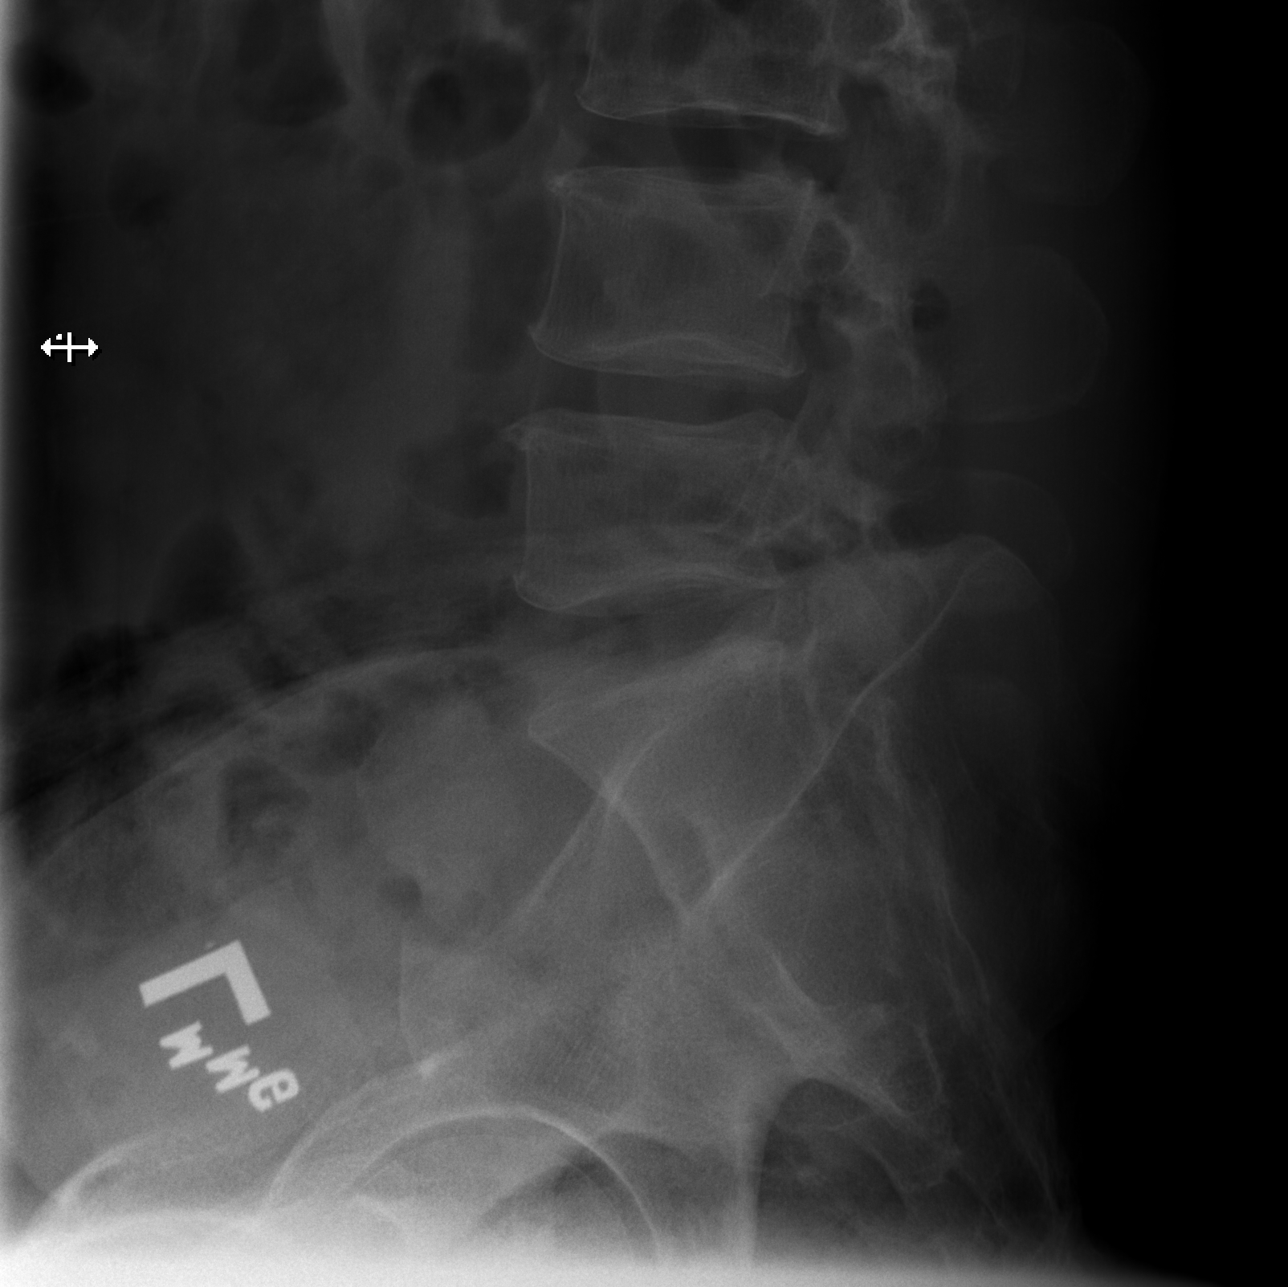

[t lumbar spine obl (1 of 2)]
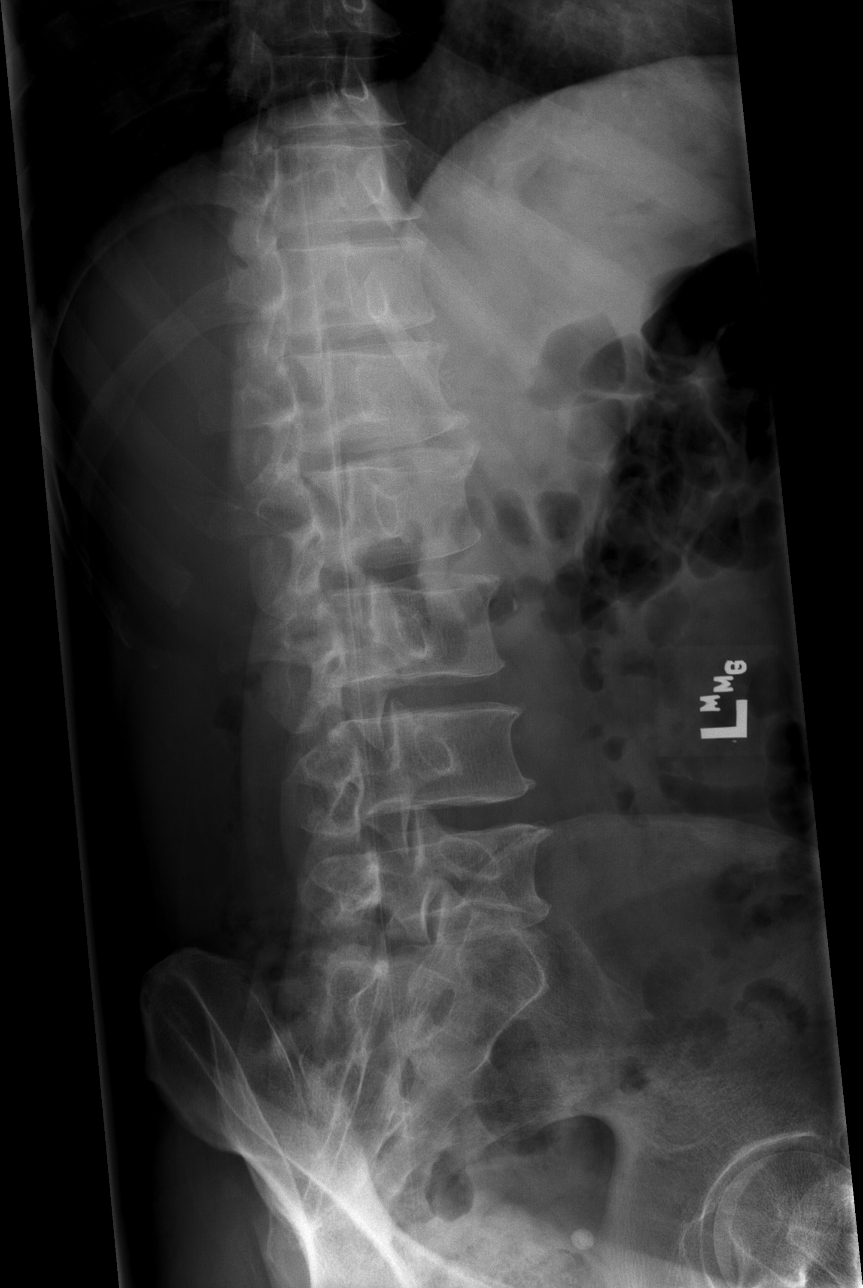

[t lumbar spine ap]
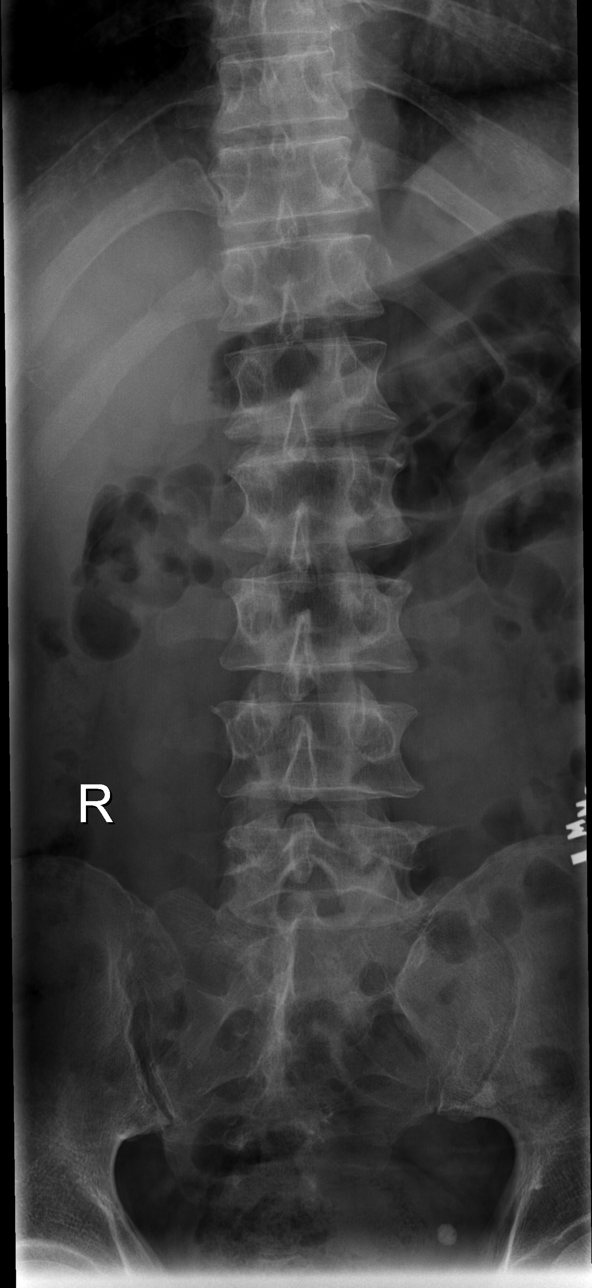

[t lumbar spine obl (2 of 2)]
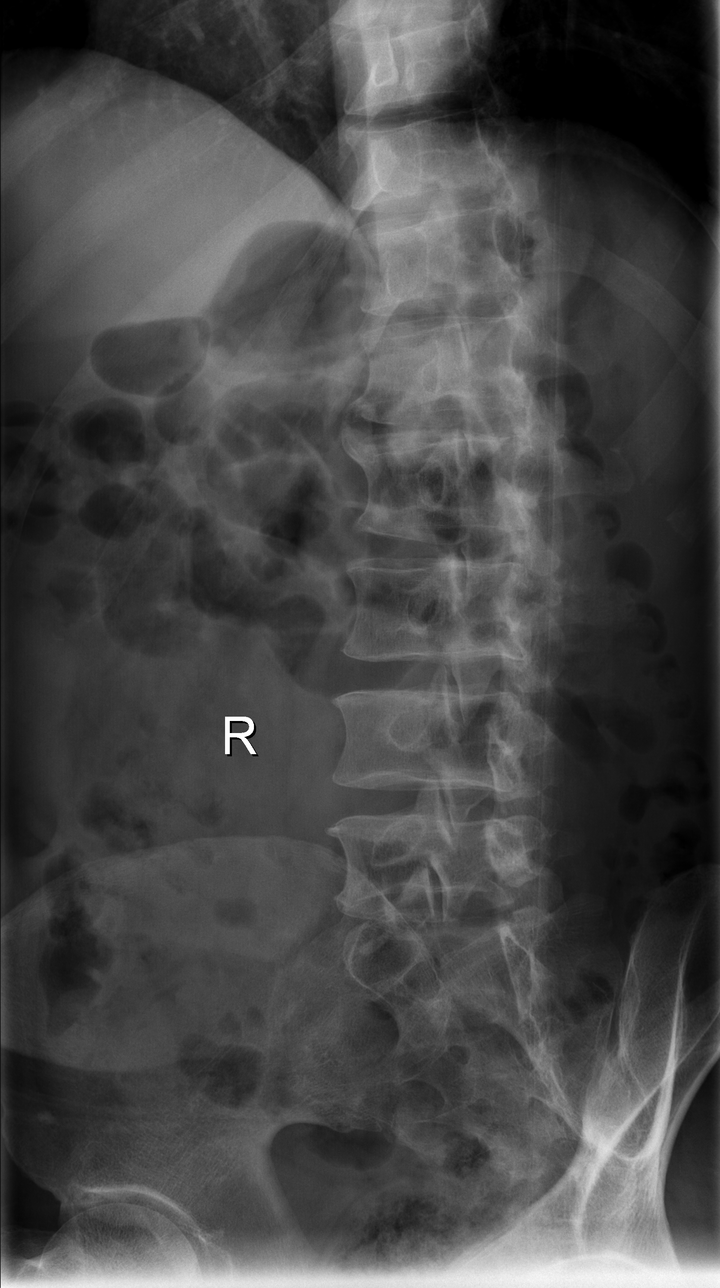

[5 of 5 positions shown; findings below may reference images not displayed]

FINDINGS: Paraspinal soft tissues are normal. Diffuse multilevel degenerative
change. No acute bony abnormality identified. No evidence of
fracture. Normal alignment. Pelvic calcifications consistent
phleboliths. Air-filled loops of small large bowel noted consistent
adynamic ileus.
IMPRESSION: 1. Diffuse multilevel degenerative change. No acute bony abnormality
identified.

2.  Air-filled loops of small large bowel suggesting adynamic ileus.

## 2023-02-20 ENCOUNTER — Inpatient Hospital Stay (HOSPITAL_COMMUNITY)
Admission: EM | Admit: 2023-02-20 | Discharge: 2023-02-23 | DRG: 072 | Disposition: A | Discharge status: Home / Self Care | Attending: Internal Medicine | Admitting: Internal Medicine

## 2023-02-20 ENCOUNTER — Emergency Department (HOSPITAL_COMMUNITY): Payer: Self-pay

## 2023-02-20 ENCOUNTER — Other Ambulatory Visit: Payer: Self-pay

## 2023-02-20 DIAGNOSIS — F1910 Other psychoactive substance abuse, uncomplicated: Secondary | ICD-10-CM | POA: Diagnosis present

## 2023-02-20 DIAGNOSIS — F1410 Cocaine abuse, uncomplicated: Secondary | ICD-10-CM | POA: Diagnosis present

## 2023-02-20 DIAGNOSIS — G9341 Metabolic encephalopathy: Secondary | ICD-10-CM | POA: Diagnosis present

## 2023-02-20 DIAGNOSIS — R4182 Altered mental status, unspecified: Secondary | ICD-10-CM

## 2023-02-20 DIAGNOSIS — Z79899 Other long term (current) drug therapy: Secondary | ICD-10-CM

## 2023-02-20 DIAGNOSIS — F17210 Nicotine dependence, cigarettes, uncomplicated: Secondary | ICD-10-CM | POA: Diagnosis present

## 2023-02-20 DIAGNOSIS — R339 Retention of urine, unspecified: Secondary | ICD-10-CM | POA: Diagnosis present

## 2023-02-20 DIAGNOSIS — G2579 Other drug induced movement disorders: Secondary | ICD-10-CM

## 2023-02-20 DIAGNOSIS — F1510 Other stimulant abuse, uncomplicated: Secondary | ICD-10-CM | POA: Diagnosis present

## 2023-02-20 DIAGNOSIS — M549 Dorsalgia, unspecified: Secondary | ICD-10-CM | POA: Diagnosis present

## 2023-02-20 DIAGNOSIS — G9081 Serotonin syndrome: Secondary | ICD-10-CM

## 2023-02-20 DIAGNOSIS — R338 Other retention of urine: Secondary | ICD-10-CM | POA: Diagnosis not present

## 2023-02-20 DIAGNOSIS — I10 Essential (primary) hypertension: Secondary | ICD-10-CM | POA: Diagnosis present

## 2023-02-20 DIAGNOSIS — F1590 Other stimulant use, unspecified, uncomplicated: Secondary | ICD-10-CM

## 2023-02-20 LAB — TSH: TSH: 1.82 u[IU]/mL (ref 0.350–4.500)

## 2023-02-20 LAB — URINALYSIS, ROUTINE W REFLEX MICROSCOPIC
Bilirubin Urine: NEGATIVE
Glucose, UA: NEGATIVE mg/dL
Ketones, ur: NEGATIVE mg/dL
Leukocytes,Ua: NEGATIVE
Nitrite: NEGATIVE
Protein, ur: 100 mg/dL — AB
Specific Gravity, Urine: 1.032 — ABNORMAL HIGH (ref 1.005–1.030)
pH: 5 (ref 5.0–8.0)

## 2023-02-20 LAB — COMPREHENSIVE METABOLIC PANEL
ALT: 23 U/L (ref 0–44)
AST: 24 U/L (ref 15–41)
Albumin: 4.4 g/dL (ref 3.5–5.0)
Alkaline Phosphatase: 70 U/L (ref 38–126)
Anion gap: 14 (ref 5–15)
BUN: 17 mg/dL (ref 6–20)
CO2: 24 mmol/L (ref 22–32)
Calcium: 9.3 mg/dL (ref 8.9–10.3)
Chloride: 101 mmol/L (ref 98–111)
Creatinine, Ser: 1.11 mg/dL (ref 0.61–1.24)
GFR, Estimated: >60 mL/min (ref >60)
Glucose, Bld: 124 mg/dL — ABNORMAL HIGH (ref 70–99)
Potassium: 3.8 mmol/L (ref 3.5–5.1)
Sodium: 139 mmol/L (ref 135–145)
Total Bilirubin: 0.9 mg/dL (ref 0.3–1.2)
Total Protein: 7.8 g/dL (ref 6.5–8.1)

## 2023-02-20 LAB — I-STAT ARTERIAL BLOOD GAS, ED
Acid-Base Excess: 1 mmol/L (ref 0.0–2.0)
Bicarbonate: 24.6 mmol/L (ref 20.0–28.0)
Calcium, Ion: 1.15 mmol/L (ref 1.15–1.40)
HCT: 44 % (ref 39.0–52.0)
Hemoglobin: 15 g/dL (ref 13.0–17.0)
O2 Saturation: 99 %
Patient temperature: 98.9
Potassium: 3.8 mmol/L (ref 3.5–5.1)
Sodium: 139 mmol/L (ref 135–145)
TCO2: 26 mmol/L (ref 22–32)
pCO2 arterial: 36.2 mmHg (ref 32–48)
pH, Arterial: 7.441 (ref 7.35–7.45)
pO2, Arterial: 116 mmHg — ABNORMAL HIGH (ref 83–108)

## 2023-02-20 LAB — I-STAT CHEM 8, ED
BUN: 23 mg/dL — ABNORMAL HIGH (ref 6–20)
Calcium, Ion: 1.1 mmol/L — ABNORMAL LOW (ref 1.15–1.40)
Chloride: 103 mmol/L (ref 98–111)
Creatinine, Ser: 1 mg/dL (ref 0.61–1.24)
Glucose, Bld: 128 mg/dL — ABNORMAL HIGH (ref 70–99)
HCT: 47 % (ref 39.0–52.0)
Hemoglobin: 16 g/dL (ref 13.0–17.0)
Potassium: 4.1 mmol/L (ref 3.5–5.1)
Sodium: 141 mmol/L (ref 135–145)
TCO2: 28 mmol/L (ref 22–32)

## 2023-02-20 LAB — CBC WITH DIFFERENTIAL/PLATELET
Abs Immature Granulocytes: 0.06 10*3/uL (ref 0.00–0.07)
Basophils Absolute: 0 10*3/uL (ref 0.0–0.1)
Basophils Relative: 0 %
Eosinophils Absolute: 0 10*3/uL (ref 0.0–0.5)
Eosinophils Relative: 0 %
HCT: 47.1 % (ref 39.0–52.0)
Hemoglobin: 16 g/dL (ref 13.0–17.0)
Immature Granulocytes: 1 %
Lymphocytes Relative: 6 %
Lymphs Abs: 0.8 10*3/uL (ref 0.7–4.0)
MCH: 30.5 pg (ref 26.0–34.0)
MCHC: 34 g/dL (ref 30.0–36.0)
MCV: 89.9 fL (ref 80.0–100.0)
Monocytes Absolute: 0.5 10*3/uL (ref 0.1–1.0)
Monocytes Relative: 4 %
Neutro Abs: 11.4 10*3/uL — ABNORMAL HIGH (ref 1.7–7.7)
Neutrophils Relative %: 89 %
Platelets: 211 10*3/uL (ref 150–400)
RBC: 5.24 MIL/uL (ref 4.22–5.81)
RDW: 12.1 % (ref 11.5–15.5)
WBC: 12.8 10*3/uL — ABNORMAL HIGH (ref 4.0–10.5)
nRBC: 0 % (ref 0.0–0.2)

## 2023-02-20 LAB — RAPID URINE DRUG SCREEN, HOSP PERFORMED
Amphetamines: POSITIVE — AB
Barbiturates: NOT DETECTED
Benzodiazepines: NOT DETECTED
Cocaine: POSITIVE — AB
Opiates: NOT DETECTED
Tetrahydrocannabinol: NOT DETECTED

## 2023-02-20 LAB — HIV ANTIBODY (ROUTINE TESTING W REFLEX): HIV Screen 4th Generation wRfx: NONREACTIVE

## 2023-02-20 LAB — ACETAMINOPHEN LEVEL: Acetaminophen (Tylenol), Serum: <10 ug/mL — ABNORMAL LOW (ref 10–30)

## 2023-02-20 LAB — CK: Total CK: 261 U/L (ref 49–397)

## 2023-02-20 LAB — LIPASE, BLOOD: Lipase: 33 U/L (ref 11–51)

## 2023-02-20 LAB — ETHANOL: Alcohol, Ethyl (B): <10 mg/dL (ref <10)

## 2023-02-20 LAB — AMMONIA: Ammonia: 39 umol/L — ABNORMAL HIGH (ref 9–35)

## 2023-02-20 LAB — CBG MONITORING, ED: Glucose-Capillary: 128 mg/dL — ABNORMAL HIGH (ref 70–99)

## 2023-02-20 LAB — MRSA NEXT GEN BY PCR, NASAL: MRSA by PCR Next Gen: NOT DETECTED

## 2023-02-20 LAB — SALICYLATE LEVEL: Salicylate Lvl: <7 mg/dL — ABNORMAL LOW (ref 7.0–30.0)

## 2023-02-20 MED ORDER — THIAMINE HCL 100 MG/ML IJ SOLN
100.0000 mg | Freq: Every day | INTRAMUSCULAR | Status: DC
  Administered 2023-02-21: 100 mg via INTRAVENOUS
  Filled 2023-02-20: qty 2

## 2023-02-20 MED ORDER — LORAZEPAM 2 MG/ML IJ SOLN
0.0000 mg | Freq: Four times a day (QID) | INTRAMUSCULAR | Status: AC
  Administered 2023-02-20: 1 mg via INTRAVENOUS
  Administered 2023-02-20: 2 mg via INTRAVENOUS
  Filled 2023-02-20 (×2): qty 1

## 2023-02-20 MED ORDER — MIDAZOLAM HCL 2 MG/2ML IJ SOLN
INTRAMUSCULAR | Status: AC
  Administered 2023-02-20: 2 mg
  Filled 2023-02-20: qty 2

## 2023-02-20 MED ORDER — POLYETHYLENE GLYCOL 3350 17 G PO PACK: 17.0000 g | PACK | Freq: Every day | ORAL | Status: DC | PRN

## 2023-02-20 MED ORDER — ORAL CARE MOUTH RINSE: 15.0000 mL | OROMUCOSAL | Status: DC | PRN

## 2023-02-20 MED ORDER — ADULT MULTIVITAMIN W/MINERALS CH
1.0000 | ORAL_TABLET | Freq: Every day | ORAL | Status: DC
  Administered 2023-02-22 – 2023-02-23 (×2): 1 via ORAL
  Filled 2023-02-20 (×2): qty 1

## 2023-02-20 MED ORDER — DEXMEDETOMIDINE HCL IN NACL 400 MCG/100ML IV SOLN
0.0000 ug/kg/h | INTRAVENOUS | Status: DC
  Administered 2023-02-20: 0.4 ug/kg/h via INTRAVENOUS
  Filled 2023-02-20: qty 100

## 2023-02-20 MED ORDER — CYPROHEPTADINE HCL 4 MG PO TABS
4.0000 mg | ORAL_TABLET | Freq: Once | ORAL | Status: DC
  Filled 2023-02-20: qty 1

## 2023-02-20 MED ORDER — DOCUSATE SODIUM 100 MG PO CAPS: 100.0000 mg | ORAL_CAPSULE | Freq: Two times a day (BID) | ORAL | Status: DC | PRN

## 2023-02-20 MED ORDER — CHLORHEXIDINE GLUCONATE CLOTH 2 % EX PADS
6.0000 | MEDICATED_PAD | Freq: Every day | CUTANEOUS | Status: DC
  Administered 2023-02-20 – 2023-02-23 (×4): 6 via TOPICAL

## 2023-02-20 MED ORDER — THIAMINE MONONITRATE 100 MG PO TABS
100.0000 mg | ORAL_TABLET | Freq: Every day | ORAL | Status: DC
  Administered 2023-02-22 – 2023-02-23 (×2): 100 mg via ORAL
  Filled 2023-02-20 (×2): qty 1

## 2023-02-20 MED ORDER — ORAL CARE MOUTH RINSE
15.0000 mL | OROMUCOSAL | Status: DC
  Administered 2023-02-20 – 2023-02-23 (×12): 15 mL via OROMUCOSAL

## 2023-02-20 MED ORDER — LORAZEPAM 2 MG/ML IJ SOLN: 0.0000 mg | Freq: Two times a day (BID) | INTRAMUSCULAR | Status: DC

## 2023-02-20 MED ORDER — LACTATED RINGERS IV BOLUS
1000.0000 mL | Freq: Once | INTRAVENOUS | Status: AC
  Administered 2023-02-20: 1000 mL via INTRAVENOUS

## 2023-02-20 MED ORDER — LORAZEPAM 1 MG PO TABS
1.0000 mg | ORAL_TABLET | ORAL | Status: AC | PRN
  Administered 2023-02-21: 2 mg via ORAL
  Filled 2023-02-20 (×2): qty 2

## 2023-02-20 MED ORDER — ENOXAPARIN SODIUM 40 MG/0.4ML IJ SOSY
40.0000 mg | PREFILLED_SYRINGE | INTRAMUSCULAR | Status: DC
  Administered 2023-02-20 – 2023-02-23 (×4): 40 mg via SUBCUTANEOUS
  Filled 2023-02-20 (×4): qty 0.4

## 2023-02-20 MED ORDER — LORAZEPAM 2 MG/ML IJ SOLN
2.0000 mg | Freq: Once | INTRAMUSCULAR | Status: AC
  Administered 2023-02-20: 2 mg via INTRAVENOUS
  Filled 2023-02-20: qty 1

## 2023-02-20 MED ORDER — FOLIC ACID 1 MG PO TABS: 1.0000 mg | ORAL_TABLET | Freq: Every day | ORAL | Status: DC

## 2023-02-20 MED ORDER — LORAZEPAM 2 MG/ML IJ SOLN
1.0000 mg | INTRAMUSCULAR | Status: AC | PRN
  Administered 2023-02-20 – 2023-02-21 (×3): 2 mg via INTRAVENOUS
  Filled 2023-02-20 (×4): qty 1

## 2023-02-20 MED ORDER — LACTATED RINGERS IV SOLN: INTRAVENOUS | Status: DC

## 2023-02-20 MED ORDER — LORAZEPAM 2 MG/ML IJ SOLN
1.0000 mg | Freq: Once | INTRAMUSCULAR | Status: DC
  Filled 2023-02-20: qty 1

## 2023-02-20 NOTE — ED Notes (Signed)
Pt placed on capnography at this time

## 2023-02-20 NOTE — ED Provider Notes (Signed)
[place] EMERGENCY DEPARTMENT AT [facility] Provider Note   CSN: [id number] Arrival date & time: 02/20/23  0865     History  Chief Complaint  Patient presents with   Withdrawal    Kyle Singh is a 57 y.o. male.  Level 5 caveat, was hide for altered mental status.  Patient from jail with "withdrawal".  He was arrested earlier today and then developed tremors, hallucinations, sweating and altered mental status.  Jail staff not aware of any intoxication.  Patient denies any alcohol or drug use.  EMS thought he could be withdrawing from alcohol. Patient denies any pain.  He denies any headache, chest pain, shortness of breath, nausea or vomiting.  Denies fever.  He denies any injection drug use or pill use.  Denies packing anything in his rectum.  He denies any prescription medications or chronic medical conditions.  Limited history available in chart review.  The history is provided by the patient and the police.       Home Medications Prior to Admission medications   Medication Sig Start Date End Date Taking? Authorizing Provider  gabapentin (NEURONTIN) 300 MG capsule Take 1 capsule (300 mg total) by mouth 3 (three) times daily. 09/01/18   Elpidio Anis, PA-C  HYDROcodone-acetaminophen (NORCO/VICODIN) 5-325 MG tablet Take 1 tablet by mouth every 6 (six) hours as needed. 09/01/18   Elpidio Anis, PA-C  methocarbamol (ROBAXIN) 500 MG tablet Take 1 tablet (500 mg total) by mouth every 6 (six) hours as needed for muscle spasms. 09/01/18   Elpidio Anis, PA-C  methylPREDNISolone (MEDROL) 4 MG TBPK tablet Take according to package insert 08/29/18   Costella, Darci Current, PA-C      Allergies    Patient has no known allergies.    Review of Systems   Review of Systems  Unable to perform ROS: Mental status change    Physical Exam Updated Vital Signs Temp 97.9 F (36.6 C) (Oral)   Ht 5\' 8"  (1.727 m)   Wt 75 kg   SpO2 98%   BMI 25.14 kg/m  Physical Exam Vitals and  nursing note reviewed.  Constitutional:      General: He is not in acute distress.    Appearance: He is well-developed. He is diaphoretic.     Comments: Tremulous, resting comfortably, diaphoretic. Mumbles a few words and is not cooperative with questioning.  HENT:     Head: Normocephalic and atraumatic.     Mouth/Throat:     Pharynx: No oropharyngeal exudate.  Eyes:     Conjunctiva/sclera: Conjunctivae normal.     Pupils: Pupils are equal, round, and reactive to light.  Neck:     Comments: No meningismus. Cardiovascular:     Rate and Rhythm: Normal rate and regular rhythm.     Heart sounds: Normal heart sounds. No murmur heard. Pulmonary:     Effort: Pulmonary effort is normal. No respiratory distress.     Breath sounds: Normal breath sounds.  Abdominal:     Palpations: Abdomen is soft.     Tenderness: There is no abdominal tenderness. There is no guarding or rebound.  Musculoskeletal:        General: No tenderness. Normal range of motion.     Cervical back: Normal range of motion and neck supple.     Comments: 6-7 beats of ankle clonus bilateral  Skin:    General: Skin is warm.  Neurological:     Mental Status: He is alert.     Cranial Nerves:  No cranial nerve deficit.     Motor: No abnormal muscle tone.     Coordination: Coordination normal.     Comments: Oriented to person and place.  Moves all extremities to commands.  Psychiatric:        Behavior: Behavior normal.     ED Results / Procedures / Treatments   Labs (all labs ordered are listed, but only abnormal results are displayed) Labs Reviewed  CBC WITH DIFFERENTIAL/PLATELET - Abnormal; Notable for the following components:      Result Value   WBC 12.8 (*)    Neutro Abs 11.4 (*)    All other components within normal limits  COMPREHENSIVE METABOLIC PANEL - Abnormal; Notable for the following components:   Glucose, Bld 124 (*)    All other components within normal limits  ACETAMINOPHEN LEVEL - Abnormal;  Notable for the following components:   Acetaminophen (Tylenol), Serum <10 (*)    All other components within normal limits  SALICYLATE LEVEL - Abnormal; Notable for the following components:   Salicylate Lvl <7.0 (*)    All other components within normal limits  CBG MONITORING, ED - Abnormal; Notable for the following components:   Glucose-Capillary 128 (*)    All other components within normal limits  I-STAT ARTERIAL BLOOD GAS, ED - Abnormal; Notable for the following components:   pO2, Arterial 116 (*)    All other components within normal limits  I-STAT CHEM 8, ED - Abnormal; Notable for the following components:   BUN 23 (*)    Glucose, Bld 128 (*)    Calcium, Ion 1.10 (*)    All other components within normal limits  ETHANOL  TSH  LIPASE, BLOOD  CK  URINALYSIS, ROUTINE W REFLEX MICROSCOPIC  RAPID URINE DRUG SCREEN, HOSP PERFORMED  AMMONIA    EKG EKG Interpretation Date/Time:  Friday February 20 2023 03:26:25 EDT Ventricular Rate:  108 PR Interval:  166 QRS Duration:  88 QT Interval:  334 QTC Calculation: 448 R Axis:   49  Text Interpretation: Sinus tachycardia Right atrial enlargement Rate faster Confirmed by Glynn Octave [phone]) on 02/20/2023 3:35:38 AM  Radiology CT Head Wo Contrast  Result Date: 02/20/2023 CLINICAL DATA:  Mental status change with unknown cause. EXAM: CT HEAD WITHOUT CONTRAST TECHNIQUE: Contiguous axial images were obtained from the base of the skull through the vertex without intravenous contrast. RADIATION DOSE REDUCTION: This exam was performed according to the departmental dose-optimization program which includes automated exposure control, adjustment of the mA and/or kV according to patient size and/or use of iterative reconstruction technique. COMPARISON:  None Available. FINDINGS: Brain: No evidence of acute infarction, hemorrhage, hydrocephalus, extra-axial collection or mass lesion/mass effect. Vascular: No hyperdense vessel or unexpected  calcification. Skull: Normal. Negative for fracture or focal lesion. Sinuses/Orbits: No acute finding. Other: Moderate motion artifact IMPRESSION: 1. No acute finding. 2. Moderate motion artifact. Electronically Signed   By: Tiburcio Pea M.D.   On: 02/20/2023 06:30   DG Chest Portable 1 View  Result Date: 02/20/2023 CLINICAL DATA:  Withdrawal symptoms. EXAM: PORTABLE CHEST 1 VIEW COMPARISON:  08/23/2018 FINDINGS: Artifact from EKG leads. Normal heart size and mediastinal contours. Symmetric nipple shadows. No acute infiltrate or edema. No effusion or pneumothorax. No acute osseous findings. IMPRESSION: Negative portable chest. Electronically Signed   By: Tiburcio Pea M.D.   On: 02/20/2023 04:46    Procedures .Critical Care  Performed by: Glynn Octave, MD Authorized by: Glynn Octave, MD   Critical care provider statement:  Critical care time (minutes):  90   Critical care time was exclusive of:  Separately billable procedures and treating other patients and teaching time   Critical care was necessary to treat or prevent imminent or life-threatening deterioration of the following conditions:  Toxidrome and CNS failure or compromise   Critical care was time spent personally by me on the following activities:  Development of treatment plan with patient or surrogate, discussions with consultants, evaluation of patient's response to treatment, examination of patient, ordering and review of laboratory studies, ordering and review of radiographic studies, ordering and performing treatments and interventions, pulse oximetry, re-evaluation of patient's condition, review of old charts, blood draw for specimens and obtaining history from patient or surrogate   I assumed direction of critical care for this patient from another provider in my specialty: no     Care discussed with: admitting provider       Medications Ordered in ED Medications - No data to display  ED Course/ Medical Decision  Making/ A&P                                 Medical Decision Making Amount and/or Complexity of Data Reviewed Labs: ordered. Decision-making details documented in ED Course. Radiology: ordered and independent interpretation performed. Decision-making details documented in ED Course. ECG/medicine tests: ordered and independent interpretation performed. Decision-making details documented in ED Course.  Risk OTC drugs. Prescription drug management. Decision regarding hospitalization.   Patient from jail with altered mental status.  He is afebrile.  Denies any ingestion or drug use. Patient tachycardic, afebrile, stable blood pressure.  He appears altered and under the influence of some substance.  Concern for possible sympathomimetic overdose with diaphoresis, dizziness, tachycardia and clonus.  Fluids and Ativan given. Labs are reassuring.  Anion gap is normal.  Ethanol, salicylate and acetaminophen levels are negative. CK is normal.  No significant acidosis.   There is concern for serotonin syndrome or NMS given patient's tachycardia, diaphoresis, hypertension, hallucinations and clonus.  He denies any prescription or illicit drug use.  IV fluids and IV Ativan were ordered.  Patient initially refused Ativan stating he did not feel like he needed it.  Explained that he could have a life-threatening process that requires benzodiazepines. Some concern for possible occult alcohol withdrawal but this seems less likely.  Will give empiric cyproheptadine with concern for possible serotonin syndrome.  Discussed with neurology who will evaluate.  Admission discussed with Dr. Julian Reil.  Addendum, 6:30 AM, patient more agitated, combative, writhing around the bed, no longer following commands.  Additional benzodiazepines ordered including a total of 6 mg of Ativan and 4 mg of Versed.  Patient restrained by police.  Initiating Precedex infusion.  Concern for sympathomimetic intoxication and  possible serotonin syndrome.  Protecting airway.  Remains in police restraints.  Will need ICU admission for monitoring of airway and Precedex infusion.  Discussed with neurology Dr. Derry Lory who will consult and agrees with empiric treatment with benzodiazepines and cyproheptadine.       Final Clinical Impression(s) / ED Diagnoses Final diagnoses:  Serotonin syndrome    Rx / DC Orders ED Discharge Orders     None         , Jeannett Senior, MD 02/20/23 [phone]

## 2023-02-20 NOTE — ED Notes (Signed)
MD Rancour verbal order for 2mg  versed due to patient being combative, overrode and administered.

## 2023-02-20 NOTE — ED Notes (Signed)
Seizure pads applied to bed.  

## 2023-02-20 NOTE — ED Notes (Signed)
Patient transported to CT 

## 2023-02-20 NOTE — ED Notes (Signed)
Verbal order from Dr. Manus Gunning to administer additional 2mg  Ativan IV

## 2023-02-20 NOTE — ED Triage Notes (Signed)
Pt BIB [facility] from jail. Per report from EMS pt showing withdrawal signs (hallucinations, diaphoretic). Pt denies recent drug use, and reports last alcohol drink was 12 days ago.

## 2023-02-20 NOTE — H&P (Signed)
NAME:  Kyle Singh, MRN:  [id number], DOB:  1965-09-10, LOS: 0 ADMISSION DATE:  02/20/2023, CONSULTATION DATE:  02/20/23 REFERRING MD:  Rancour , CHIEF COMPLAINT:  AMS    History of Present Illness:  57yo M PMH substance use disorder, HTN who presented to ED shortly after being transferred to jail with AMS. At jail he had some shaking that raised c/f sz prompting presentation. In ED he was still able to converse to a degree and there were c/f EtOH withdrawals initially. Started on CIWA, rcvd BZDs and then became increasingly agitated/erratic prompting precedex initiation, CT H, neuro consult for possible toxidromal syndrome.  Prior to developing significant agitation, denied substance use and etoh use. CTH without acute abnormality. Diaphoresis, tachycardia improved w BZD + precedex   Labs are grossly normal   PCCM is consulted in this setting   Pertinent  Medical History  HTN  Hx substance use disorder   Significant Hospital Events: Including procedures, antibiotic start and stop dates in addition to other pertinent events   8/2 to ED after being transferred to jail with AMS ? Sz activity   Interim History / Subjective:  Has received repeat doses of BZD and was started on precedex with improvement in his agitation  Neuro has seen   Objective   Blood pressure 136/86, pulse 99, temperature 98.5 F (36.9 C), temperature source Rectal, resp. rate 13, height 5\' 8"  (1.727 m), weight 75 kg, SpO2 97%.        Intake/Output Summary (Last 24 hours) at 02/20/2023 0824 Last data filed at 02/20/2023 7829 Gross per 24 hour  Intake 1000 ml  Output --  Net 1000 ml   Filed Weights   02/20/23 0334  Weight: 75 kg    Examination: General: wnwd ill appearing middle aged M  HENT: NCAT pink mm clear oral secretions  Lungs: CTAb  Cardiovascular: rrr cap refill is brisk  Abdomen: soft ndnt  Extremities: no acute joint deformity  Neuro: 3mm pupils. Moving spontaneously and to stimulation. Does  not follow commands. Protecting airway. No nuchal rigidity, no clonus   Skin: flushed. Small abrasion proximal to L great toe  GU: defer  Resolved Hospital Problem list     Assessment & Plan:   Acute metabolic encephalopathy  -concern for toxidrome vs etoh withdrawals vs sz&post ictal state-- certain exam findings would argue against toxidromal syndromes but at time of my exam has received BZD and is on precedex so would confound this to a degree.  -extremely limited hx, and not sure that pt given hx in ED can be considered reliable RE no etoh and substance use  -APAP salicylate and alcohol all WNL. Not acidotic and no end organ dysfunction -- doubt volatile ingestion.   P -admit to ICU -despite unclear etiology, the most favored ddx are all grossly managed with PRN BZD + supportive care  -getting empiric cyproheptadine for possible SS but exam is less consistent with this  -cont EtCO2 -- goal 35-45; decr sedation and notify provider if etco2 suggestive of hypoventilation or if there is concern for poor airway protection  -will cont CIWA w PRN BZDs, micronutrient support -EEG -eventually MRI brain w/wo  -sz precautions  -UDS pending  -cont IVF    Best Practice (right click and "Reselect all SmartList Selections" daily)   Diet/type: NPO DVT prophylaxis: LMWH GI prophylaxis: N/A Lines: N/A Foley:  N/A Code Status:  full code Last date of multidisciplinary goals of care discussion [--]  Labs  CBC: Recent Labs  Lab 02/20/23 0354 02/20/23 0437 02/20/23 0446  WBC 12.8*  --   --   NEUTROABS 11.4*  --   --   HGB 16.0 15.0 16.0  HCT 47.1 44.0 47.0  MCV 89.9  --   --   PLT 211  --   --     Basic Metabolic Panel: Recent Labs  Lab 02/20/23 0354 02/20/23 0437 02/20/23 0446  NA 139 139 141  K 3.8 3.8 4.1  CL 101  --  103  CO2 24  --   --   GLUCOSE 124*  --  128*  BUN 17  --  23*  CREATININE 1.11  --  1.00  CALCIUM 9.3  --   --    GFR: Estimated Creatinine  Clearance: 80.8 mL/min (by C-G formula based on SCr of 1 mg/dL). Recent Labs  Lab 02/20/23 0354  WBC 12.8*    Liver Function Tests: Recent Labs  Lab 02/20/23 0354  AST 24  ALT 23  ALKPHOS 70  BILITOT 0.9  PROT 7.8  ALBUMIN 4.4   Recent Labs  Lab 02/20/23 0354  LIPASE 33   No results for input(s): "AMMONIA" in the last 168 hours.  ABG    Component Value Date/Time   PHART 7.441 02/20/2023 0437   PCO2ART 36.2 02/20/2023 0437   PO2ART 116 (H) 02/20/2023 0437   HCO3 24.6 02/20/2023 0437   TCO2 28 02/20/2023 0446   O2SAT 99 02/20/2023 0437     Coagulation Profile: No results for input(s): "INR", "PROTIME" in the last 168 hours.  Cardiac Enzymes: Recent Labs  Lab 02/20/23 0354  CKTOTAL 261    HbA1C: No results found for: "HGBA1C"  CBG: Recent Labs  Lab 02/20/23 0330  GLUCAP 128*    Review of Systems:   Unable to obtain due to encephalopathy   Past Medical History:  He,  has a past medical history of Back pain and Hypertension.   Surgical History:   Past Surgical History:  Procedure Laterality Date   LUMBAR LAMINECTOMY/DECOMPRESSION MICRODISCECTOMY Right 08/25/2018   Procedure: Right Lumbar four-five Extraforminal Microdiskectomy;  Surgeon: Tia Alert, MD;  Location: [facility];  Service: Neurosurgery;  Laterality: Right;   NO PAST SURGERIES       Social History:   reports that he has been smoking cigarettes. He has a 12 pack-year smoking history. He has never used smokeless tobacco. He reports that he does not currently use drugs after having used the following drugs: Cocaine and Marijuana. He reports that he does not drink alcohol.   Family History:  His family history is not on file.   Allergies No Known Allergies   Home Medications  Prior to Admission medications   Medication Sig Start Date End Date Taking? Authorizing Provider  gabapentin (NEURONTIN) 300 MG capsule Take 1 capsule (300 mg total) by mouth 3 (three) times daily. 09/01/18    Elpidio Anis, PA-C  HYDROcodone-acetaminophen (NORCO/VICODIN) 5-325 MG tablet Take 1 tablet by mouth every 6 (six) hours as needed. 09/01/18   Elpidio Anis, PA-C  methocarbamol (ROBAXIN) 500 MG tablet Take 1 tablet (500 mg total) by mouth every 6 (six) hours as needed for muscle spasms. 09/01/18   Elpidio Anis, PA-C  methylPREDNISolone (MEDROL) 4 MG TBPK tablet Take according to package insert 08/29/18   Costella, Darci Current, PA-C     Critical care time: 42 min     CRITICAL CARE Performed by: Lanier Clam   Total critical care time:  42 minutes  Critical care time was exclusive of separately billable procedures and treating other patients.  Critical care was necessary to treat or prevent imminent or life-threatening deterioration.  Critical care was time spent personally by me on the following activities: development of treatment plan with patient and/or surrogate as well as nursing, discussions with consultants, evaluation of patient's response to treatment, examination of patient, obtaining history from patient or surrogate, ordering and performing treatments and interventions, ordering and review of laboratory studies, ordering and review of radiographic studies, pulse oximetry and re-evaluation of patient's condition.  Tessie Fass MSN, AGACNP-BC [facility] Pulmonary/Critical Care Medicine Amion for pager 02/20/2023, 9:08 AM

## 2023-02-20 NOTE — ED Notes (Signed)
Report given to 4n at this time

## 2023-02-20 NOTE — Consult Note (Addendum)
NEUROLOGY CONSULTATION NOTE   Date of service: February 20, 2023 Patient Name: Kyle Singh MRN:  [id number] DOB:  June 20, 1966 Reason for consult: "concern for overdose" Requesting Provider: Glynn Octave, MD _ _ _   _ __   _ __ _ _  __ __   _ __   __ _  History of Present Illness  Kyle Singh is a 57 y.o. male with PMH significant for hypertension who is brought in by EMS from jail for concern for potential withdrawal. Apparently was arrested last night and seemed fine at the time. Developed hallucinations, confusion, tremulous movements, disorientation, diaphoresis, tachycardia and was brought to the ED by EMS.  Here, he is oriented to self, gets agitated with questioning. He is confused, tremulous movements. Vitals with mild tachycardia to low 100s.  EDP concerned about potential serotonin syndrome or potentially another stimulant or sympathomimetic.  He is an unreliable and poor historian secondary to confusion. Within this limitaiton, he denies drinking alcohol, denies any substance use. He gets agitated and raises his tone and voice with further questioning.   ROS   Denies any pain.  Past History   Past Medical History:  Diagnosis Date   Back pain    Hypertension    Past Surgical History:  Procedure Laterality Date   LUMBAR LAMINECTOMY/DECOMPRESSION MICRODISCECTOMY Right 08/25/2018   Procedure: Right Lumbar four-five Extraforminal Microdiskectomy;  Surgeon: Tia Alert, MD;  Location: [facility] OR;  Service: Neurosurgery;  Laterality: Right;   NO PAST SURGERIES     No family history on file. Social History   Socioeconomic History   Marital status: Single    Spouse name: Not on file   Number of children: Not on file   Years of education: Not on file   Highest education level: Not on file  Occupational History   Not on file  Tobacco Use   Smoking status: Every Day    Current packs/day: 1.00    Average packs/day: 1 pack/day for 12.0 years (12.0 ttl pk-yrs)    Types:  Cigarettes   Smokeless tobacco: Never  Vaping Use   Vaping status: Never Used  Substance and Sexual Activity   Alcohol use: No   Drug use: Not Currently    Types: Cocaine, Marijuana   Sexual activity: Not on file  Other Topics Concern   Not on file  Social History Narrative   Not on file   Social Determinants of Health   Financial Resource Strain: Not on file  Food Insecurity: Not on file  Transportation Needs: Not on file  Physical Activity: Not on file  Stress: Not on file  Social Connections: Not on file   No Known Allergies  Medications  (Not in a hospital admission)    Vitals   Vitals:   02/20/23 0530 02/20/23 0600 02/20/23 0611 02/20/23 0614  BP: (!) 148/93 (!) 140/88 (!) 140/88   Pulse: (!) 107 (!) 104 (!) 109 (!) 102  Resp: 18 19  16   Temp:      TempSrc:      SpO2: 97% 97%  95%  Weight:      Height:         Body mass index is 25.14 kg/m.  Physical Exam   General: confused, agitated in bed; HENT: Normal oropharynx and mucosa. Normal external appearance of ears and nose.  Neck: Supple, no pain or tenderness  CV: No JVD. No peripheral edema.  Pulmonary: Symmetric Chest rise. Normal respiratory effort.  Abdomen: Soft to  touch, non-tender.  Ext: No cyanosis, edema, or deformity  Skin: No rash. Normal palpation of skin.   Musculoskeletal: Normal digits and nails by inspection. No clubbing.   Neurologic Examination  Mental status/Cognition: partially opens eyes to voice, oriented to self. Appears to be responding to internal stimuli at times. Poor attention. Speech/language: Fluent, comprehension intact to simple commands, counts fingers held up, names objets. Cranial nerves:   CN II Pupils equal and reactive to light, no VF deficits. No pupilary dilation noted.   CN III,IV,VI Makes eye contact on left and right.   CN V Regards touch on both sides of his lower face.   CN VII Symmetric facial movements when talking.   CN VIII Makes eye contac to  speech   CN IX & X Unable to assess since he wont open his mouth much despite my requests.   CN XI Head midline and spontaneously moving it left and right.   CN XII midline tongue protrusion on command.   Motor:  Muscle bulk: normal, tone is mildly increased, tremulous movements in all extremities. Mvmt Root Nerve  Muscle Right Left Comments  SA C5/6 Ax Deltoid     EF C5/6 Mc Biceps 5 5   EE C6/7/8 Rad Triceps 5 5   WF C6/7 Med FCR     WE C7/8 PIN ECU     F Ab C8/T1 U ADM/FDI 5 5   HF L1/2/3 Fem Illopsoas 5 5   KE L2/3/4 Fem Quad     DF L4/5 D Peron Tib Ant 5 5   PF S1/2 Tibial Grc/Sol 5 5    Reflexes:  Right Left Comments  Pectoralis      Biceps (C5/6) 2 2   Brachioradialis (C5/6) 2 2    Triceps (C6/7) 2 2    Patellar (L3/4) 2+ 2+    Achilles (S1) 4 4 ?spontaneous clonus vs tremoring BL   Hoffman      Plantar     Jaw jerk    Sensation:  Light touch Intact throughout   Pin prick    Temperature    Vibration   Proprioception    Coordination/Complex Motor:  - Finger to Nose intact BL - Heel to shin unable to get him to do. - Rapid alternating movement: declined testing. - Gait: deferred for patient safety.  Labs   CBC:  Recent Labs  Lab 02/20/23 0354 02/20/23 0437 02/20/23 0446  WBC 12.8*  --   --   NEUTROABS 11.4*  --   --   HGB 16.0 15.0 16.0  HCT 47.1 44.0 47.0  MCV 89.9  --   --   PLT 211  --   --     Basic Metabolic Panel:  Lab Results  Component Value Date   NA 141 02/20/2023   K 4.1 02/20/2023   CO2 24 02/20/2023   GLUCOSE 128 (H) 02/20/2023   BUN 23 (H) 02/20/2023   CREATININE 1.00 02/20/2023   CALCIUM 9.3 02/20/2023   GFRNONAA >60 02/20/2023   GFRAA >60 08/24/2018   Lipid Panel: No results found for: "LDLCALC" HgbA1c: No results found for: "HGBA1C" Urine Drug Screen:  pending   Alcohol Level     Component Value Date/Time   ETH <10 02/20/2023 0354    CT Head without contrast: pending  MRI Brain: pending  rEEG:   pending  Impression   Kyle Singh is a 57 y.o. male with PMH significant for hypertension who is brought in by EMS from  jail for concern for potential withdrawal. Apparently was arrested last night and seemed fine at the time. Developed hallucinations, confusion, tremulous movements, disorientation, diaphoresis, tachycardia and was brought to the ED by EMS. His neurologic examination is notable for tremulous movements, agitation, tachycardia, confusion.  His symptoms do concern me for potential alcohol withdrawal or stimulant use or possibly serotonin syndrome. With very limited history, hard to truly narrow down.  I think it is reasonable to trial some ativan since all of the above could potentially be managed with benzos. Would expect this to be short term as potential substances are excreted from the body.  In addition, will recommend further workup with MRI Brain and rEEG.  I did consider potential meningitis but he has no nuchal rigidity, negative kernigs and brudzinski's sign.  Recommendations  - agree with trialing Ativan. Aim to titrate to agitation and to normalize vitals. - UDS pending - seizure precautions with seizure pads. - MRI Brain with and without contrast. This maybe difficult to obtain with his agitation and can be attempted once he has calmed down with benzos. Get a CT Head in the meantime. I did not order MRI Brain at this time since he is too agitated to be able to make it through it at this time. - routine EEG. - we will continue to follow. ______________________________________________________________________   Thank you for the opportunity to take part in the care of this patient. If you have any further questions, please contact the neurology consultation attending.  Signed,  Erick Blinks [facility] _ _ _   _ __   _ __ _ _  __ __   _ __   __ _

## 2023-02-20 NOTE — ED Notes (Signed)
Neuro at the bedside.

## 2023-02-21 MED ORDER — FOLIC ACID 5 MG/ML IJ SOLN
1.0000 mg | Freq: Every day | INTRAMUSCULAR | Status: DC
  Administered 2023-02-21: 1 mg via INTRAVENOUS
  Filled 2023-02-21 (×2): qty 0.2

## 2023-02-21 MED ORDER — ORAL CARE MOUTH RINSE: 15.0000 mL | OROMUCOSAL | Status: DC | PRN

## 2023-02-21 NOTE — Plan of Care (Signed)
  Problem: Activity: Goal: Risk for activity intolerance will decrease Outcome: Progressing   Problem: Nutrition: Goal: Adequate nutrition will be maintained Outcome: Progressing   

## 2023-02-21 NOTE — Plan of Care (Signed)

## 2023-02-21 NOTE — Progress Notes (Signed)
Attempted reported to nurse for 2W (x3) with no success. Will attempt again

## 2023-02-21 NOTE — Progress Notes (Signed)
   NAME:  Kyle Singh, MRN:  [id number], DOB:  10/10/1965, LOS: 1 ADMISSION DATE:  02/20/2023, CONSULTATION DATE:  02/20/23 REFERRING MD:  Rancour , CHIEF COMPLAINT:  AMS    History of Present Illness:  57yo M PMH substance use disorder, HTN who presented to ED shortly after being transferred to jail with AMS. At jail he had some shaking that raised c/f sz prompting presentation. In ED he was still able to converse to a degree and there were c/f EtOH withdrawals initially. Started on CIWA, rcvd BZDs and then became increasingly agitated/erratic prompting precedex initiation, CT H, neuro consult for possible toxidromal syndrome.  Prior to developing significant agitation, denied substance use and etoh use. CTH without acute abnormality. Diaphoresis, tachycardia improved w BZD + precedex   Labs are grossly normal   Toxicology positive for amphetamines and cocaine.  EtOH level less than 10  PCCM is consulted in this setting   Pertinent  Medical History  HTN  Hx substance use disorder   Significant Hospital Events: Including procedures, antibiotic start and stop dates in addition to other pertinent events   8/2 to ED after being transferred to jail with AMS ? Sz activity  8/3 off Precedex drip, requiring very infrequent Ativan pushes for elevated CIWA score  Interim History / Subjective:  Sleepy on exam, grunts to verbal stimuli  Objective   Blood pressure (!) 144/101, pulse 99, temperature 98.2 F (36.8 C), temperature source Axillary, resp. rate (!) 21, height 5\' 8"  (1.727 m), weight 64.5 kg, SpO2 97%.        Intake/Output Summary (Last 24 hours) at 02/21/2023 1009 Last data filed at 02/21/2023 1000 Gross per 24 hour  Intake 2411.31 ml  Output 450 ml  Net 1961.31 ml   Filed Weights   02/20/23 0334 02/20/23 0930  Weight: 75 kg 64.5 kg    Examination: General: Well-appearing middle-aged male lying in bed in no acute distress HEENT: [place]/AT, MM pink/moist, PERRL,  Neuro: Sleepy but  arouses to verbal stimuli CV: s1s2 regular rate and rhythm, no murmur, rubs, or gallops,  PULM: Clear to auscultation bilaterally, no increased work of breathing, no added breath sounds GI: soft, bowel sounds active in all 4 quadrants, non-tender, non-distended Extremities: warm/dry, no edema  Skin: no rashes or lesions   Resolved Hospital Problem list     Assessment & Plan:   Acute metabolic encephalopathy  -Clinical workup thus far consistent with likely withdrawal from illicit substance -UDS positive for amphetamines and cocaine on admission P: CIWA protocol Continue as needed benzodiazepines, patient currently requiring very infrequent dosing Precedex drip as needed Minimize sedation Gentle IV hydration Supplement thiamine, folate, multivitamin Seizure precautions  Urinary retention/decreased urine output -Patient has required 2 INO cath for urinary retention P: Continue bladder scans to assess for retention, can complete 1 more I&O, If retention persist after will place Foley Continue IV hydration  Best Practice (right click and "Reselect all SmartList Selections" daily)   Diet/type: NPO DVT prophylaxis: LMWH GI prophylaxis: N/A Lines: N/A Foley:  N/A Code Status:  full code Last date of multidisciplinary goals of care discussion: Update as able   Critical care time: NA    D. Harris, NP-C [facility] Personal contact information can be found on Amion  If no contact or response made please call [phone] 02/21/2023, 10:46 AM

## 2023-02-21 NOTE — Progress Notes (Signed)
eLink Physician-Brief Progress Note Patient Name: Kyle Singh DOB: 10-18-1965 MRN: [id number]   Date of Service  02/21/2023  HPI/Events of Note  Urinary retention. Bladder scan 260. Currently on mIVF  eICU Interventions  I&O ordered     Intervention Category Minor Interventions: Routine modifications to care plan (e.g. PRN medications for pain, fever)   Mechele Collin 02/21/2023, 5:24 AM

## 2023-02-22 ENCOUNTER — Encounter (HOSPITAL_COMMUNITY): Payer: Self-pay | Admitting: Pulmonary Disease

## 2023-02-22 DIAGNOSIS — G9341 Metabolic encephalopathy: Principal | ICD-10-CM

## 2023-02-22 DIAGNOSIS — F1910 Other psychoactive substance abuse, uncomplicated: Secondary | ICD-10-CM | POA: Diagnosis not present

## 2023-02-22 DIAGNOSIS — R338 Other retention of urine: Secondary | ICD-10-CM | POA: Diagnosis present

## 2023-02-22 DIAGNOSIS — I10 Essential (primary) hypertension: Secondary | ICD-10-CM | POA: Diagnosis not present

## 2023-02-22 LAB — CBC WITH DIFFERENTIAL/PLATELET
Abs Immature Granulocytes: 0.03 10*3/uL (ref 0.00–0.07)
Basophils Absolute: 0 10*3/uL (ref 0.0–0.1)
Basophils Relative: 1 %
Eosinophils Absolute: 0.2 10*3/uL (ref 0.0–0.5)
Eosinophils Relative: 3 %
HCT: 40.1 % (ref 39.0–52.0)
Hemoglobin: 13.5 g/dL (ref 13.0–17.0)
Immature Granulocytes: 0 %
Lymphocytes Relative: 14 %
Lymphs Abs: 1.2 10*3/uL (ref 0.7–4.0)
MCH: 31.2 pg (ref 26.0–34.0)
MCHC: 33.7 g/dL (ref 30.0–36.0)
MCV: 92.6 fL (ref 80.0–100.0)
Monocytes Absolute: 0.7 10*3/uL (ref 0.1–1.0)
Monocytes Relative: 9 %
Neutro Abs: 6.2 10*3/uL (ref 1.7–7.7)
Neutrophils Relative %: 73 %
Platelets: 214 10*3/uL (ref 150–400)
RBC: 4.33 MIL/uL (ref 4.22–5.81)
RDW: 12.1 % (ref 11.5–15.5)
WBC: 8.5 10*3/uL (ref 4.0–10.5)
nRBC: 0 % (ref 0.0–0.2)

## 2023-02-22 LAB — MAGNESIUM: Magnesium: 2 mg/dL (ref 1.7–2.4)

## 2023-02-22 LAB — RENAL FUNCTION PANEL
Albumin: 2.9 g/dL — ABNORMAL LOW (ref 3.5–5.0)
Anion gap: 7 (ref 5–15)
BUN: 12 mg/dL (ref 6–20)
CO2: 28 mmol/L (ref 22–32)
Calcium: 8.2 mg/dL — ABNORMAL LOW (ref 8.9–10.3)
Chloride: 100 mmol/L (ref 98–111)
Creatinine, Ser: 0.91 mg/dL (ref 0.61–1.24)
GFR, Estimated: >60 mL/min (ref >60)
Glucose, Bld: 163 mg/dL — ABNORMAL HIGH (ref 70–99)
Phosphorus: 3.2 mg/dL (ref 2.5–4.6)
Potassium: 3.6 mmol/L (ref 3.5–5.1)
Sodium: 135 mmol/L (ref 135–145)

## 2023-02-22 MED ORDER — METHOCARBAMOL 500 MG PO TABS: 500.0000 mg | ORAL_TABLET | Freq: Three times a day (TID) | ORAL | Status: DC | PRN

## 2023-02-22 MED ORDER — LOPERAMIDE HCL 2 MG PO CAPS: 2.0000 mg | ORAL_CAPSULE | ORAL | Status: DC | PRN

## 2023-02-22 MED ORDER — ONDANSETRON 4 MG PO TBDP: 4.0000 mg | ORAL_TABLET | Freq: Four times a day (QID) | ORAL | Status: DC | PRN

## 2023-02-22 MED ORDER — PANTOPRAZOLE SODIUM 40 MG PO TBEC
40.0000 mg | DELAYED_RELEASE_TABLET | Freq: Every day | ORAL | Status: DC
  Administered 2023-02-22 – 2023-02-23 (×2): 40 mg via ORAL
  Filled 2023-02-22 (×2): qty 1

## 2023-02-22 MED ORDER — DICYCLOMINE HCL 20 MG PO TABS: 20.0000 mg | ORAL_TABLET | Freq: Four times a day (QID) | ORAL | Status: DC | PRN

## 2023-02-22 MED ORDER — HYDROXYZINE HCL 25 MG PO TABS: 25.0000 mg | ORAL_TABLET | Freq: Four times a day (QID) | ORAL | Status: DC | PRN

## 2023-02-22 MED ORDER — NAPROXEN 250 MG PO TABS: 500.0000 mg | ORAL_TABLET | Freq: Two times a day (BID) | ORAL | Status: DC | PRN

## 2023-02-22 MED ORDER — FOLIC ACID 1 MG PO TABS
1.0000 mg | ORAL_TABLET | Freq: Every day | ORAL | Status: DC
  Administered 2023-02-22 – 2023-02-23 (×2): 1 mg via ORAL
  Filled 2023-02-22 (×2): qty 1

## 2023-02-22 NOTE — Plan of Care (Signed)

## 2023-02-22 NOTE — Progress Notes (Signed)
PROGRESS NOTE    Kyle Singh  ONG:[id number] DOB: 1966/05/18 DOA: 02/20/2023 PCP: Kyle Singh, No Pcp Per    Chief Complaint  Kyle Singh presents with   Withdrawal    Brief Narrative:  Kyle Singh 57 year old gentleman history of substance use disorder, hypertension presented to the ED after being transferred to jail with altered mental status.  Kyle Singh noted in jail to have developed hallucinations, confusion, tremulousness movements, disorientation, diaphoresis, tachycardia prompting Kyle Singh to be brought to the ED.  Kyle Singh seen in the ED started on the CIWA protocol received benzos however became increasingly agitated and erratic prompting initiation of Precedex and admission to the ICU.  CT head done negative.  Neurology consulted.  Kyle Singh slowly improved, subsequently weaned off Precedex drip and transferred out of the [facility] to [facility].   Assessment & Plan:   Principal Problem:   Acute metabolic encephalopathy Active Problems:   HTN (hypertension)   Acute urinary retention   Polysubstance abuse (HCC)  #1 acute metabolic encephalopathy -Kyle Singh presented with signs and symptoms concerning for withdrawal from illicit substances or alcohol use. -Kyle Singh denies any polysubstance abuse however UDS was positive for amphetamines and cocaine on admission. -Alcohol level on admission noted at < 10. -CT head negative for any acute abnormalities. -Kyle Singh with no signs or symptoms of infection. -Kyle Singh seen in consultation by neurology who also felt Kyle Singh likely withdrawing. -Due to Kyle Singh's worsening agitation and erratic behavior Kyle Singh transferred to the ICU and placed on a Precedex drip. -Precedex drip weaned off and Kyle Singh currently on as needed Ativan. -Kyle Singh improving clinically, alert and oriented to self place and time, less agitated however does appear clammy on examination. -Continue Ativan withdrawal protocol, multivitamin, thiamine. -Kyle Singh noted per critical care to  not be requiring lots of as needed Ativan. -Place on clonidine detox protocol. -Supportive care.  2.  Polysubstance abuse -Kyle Singh denies polysubstance abuse, although UDS was positive for amphetamines and cocaine.  3.  Urinary retention/decreased urine output -Kyle Singh noted.  ICU noted to have required 2 I and O cath for urinary retention. -Continue bladder scans to assess for urinary retention. -If persistent urinary retention, trial of Flomax and may need to place a Foley catheter. -IV fluids.  4.  Hypertension -BP currently stable. -Not on antihypertensive medications per med rec prior to admission.   DVT prophylaxis: Lovenox Code Status: Full Family Communication: Updated Kyle Singh and police officer at bedside. Disposition: Back to jail when medically stable with clinical improvement.  Status is: Inpatient Remains inpatient appropriate because: Severity of illness   Consultants:  Neurology: Dr.Khaliqdina 02/20/2023  Procedures: CT head 02/20/2023 Chest x-ray 02/20/2023  Significant Hospital Events: Including procedures, antibiotic start and stop dates in addition to other pertinent events   8/2 to ED after being transferred to jail with AMS ? Sz activity  8/3 off Precedex drip, requiring very infrequent Ativan pushes for elevated CIWA score  Antimicrobials: Anti-infectives (From admission, onward)    None         Subjective: Laying in bed.  Slightly drowsy but feels he has improved since admission.  Alert to self place and time.  Denies any chest pain or shortness of breath.  No abdominal pain.  Tolerating current diet.  Looks somewhat clammy.  Emergency planning/management officer at bedside.  Kyle Singh denies any drug use.  Objective: Vitals:   02/21/23 1900 02/22/23 0122 02/22/23 0557 02/22/23 0806  BP: 125/78 123/89 (!) 124/90 125/88  Pulse: 99 84 71 72  Resp: 17 18 18 18   Temp:  98.1 F (36.7 C) 97.6 F (36.4 C) 97.8 F (36.6 C)  TempSrc:  Oral    SpO2: 95% 99% 99% 100%   Weight:      Height:        Intake/Output Summary (Last 24 hours) at 02/22/2023 1556 Last data filed at 02/22/2023 0358 Gross per 24 hour  Intake 1273.16 ml  Output 250 ml  Net 1023.16 ml   Filed Weights   02/20/23 0334 02/20/23 0930  Weight: 75 kg 64.5 kg    Examination:  General exam: Slightly drowsy.  Somewhat clammy. Respiratory system: Clear to auscultation.  No wheezes, no crackles, no rhonchi.  Fair air movement.  Speaking in full sentences.  Respiratory effort normal. Cardiovascular system: S1 & S2 heard, RRR. No JVD, murmurs, rubs, gallops or clicks. No pedal edema. Gastrointestinal system: Abdomen is nondistended, soft and nontender. No organomegaly or masses felt. Normal bowel sounds heard. Central nervous system: Alert and oriented. No focal neurological deficits. Extremities: Symmetric 5 x 5 power. Skin: No rashes, lesions or ulcers Psychiatry: Judgement and insight appear normal. Mood & affect appropriate.     Data Reviewed: I have personally reviewed following labs and imaging studies  CBC: Recent Labs  Lab 02/20/23 0354 02/20/23 0437 02/20/23 0446 02/22/23 0929  WBC 12.8*  --   --  8.5  NEUTROABS 11.4*  --   --  6.2  HGB 16.0 15.0 16.0 13.5  HCT 47.1 44.0 47.0 40.1  MCV 89.9  --   --  92.6  PLT 211  --   --  214    Basic Metabolic Panel: Recent Labs  Lab 02/20/23 0354 02/20/23 0437 02/20/23 0446 02/22/23 0929  NA 139 139 141 135  K 3.8 3.8 4.1 3.6  CL 101  --  103 100  CO2 24  --   --  28  GLUCOSE 124*  --  128* 163*  BUN 17  --  23* 12  CREATININE 1.11  --  1.00 0.91  CALCIUM 9.3  --   --  8.2*  MG  --   --   --  2.0  PHOS  --   --   --  3.2    GFR: Estimated Creatinine Clearance: 83.7 mL/min (by C-G formula based on SCr of 0.91 mg/dL).  Liver Function Tests: Recent Labs  Lab 02/20/23 0354 02/22/23 0929  AST 24  --   ALT 23  --   ALKPHOS 70  --   BILITOT 0.9  --   PROT 7.8  --   ALBUMIN 4.4 2.9*    CBG: Recent Labs  Lab  02/20/23 0330  GLUCAP 128*     Recent Results (from the past 240 hour(s))  MRSA Next Gen by PCR, Nasal     Status: None   Collection Time: 02/20/23  9:35 AM   Specimen: Nasal Mucosa; Nasal Swab  Result Value Ref Range Status   MRSA by PCR Next Gen NOT DETECTED NOT DETECTED Final    Comment: (NOTE) The GeneXpert MRSA Assay (FDA approved for NASAL specimens only), is one component of a comprehensive MRSA colonization surveillance program. It is not intended to diagnose MRSA infection nor to guide or monitor treatment for MRSA infections. Test performance is not FDA approved in patients less than 24 years old. Performed at [facility], [address], [place], [place] [address]          Radiology Studies: No results found.      Scheduled Meds:  Chlorhexidine Gluconate Cloth  6 each Topical Daily   enoxaparin (LOVENOX) injection  40 mg Subcutaneous Q24H   folic acid  1 mg Intravenous Daily   LORazepam  0-4 mg Intravenous Q12H   LORazepam  1 mg Intravenous Once   multivitamin with minerals  1 tablet Oral Daily   mouth rinse  15 mL Mouth Rinse 4 times per day   thiamine  100 mg Oral Daily   Or   thiamine  100 mg Intravenous Daily   Continuous Infusions:  lactated ringers 100 mL/hr at 02/22/23 0358     LOS: 2 days    Time spent: 40 minutes    Ramiro Harvest, MD [facility]   To contact the attending provider between 7A-7P or the covering provider during after hours 7P-7A, please log into the web site [email or web address] and access using universal [place] password for that web site. If you do not have the password, please call the hospital operator.  02/22/2023, 3:56 PM

## 2023-02-23 DIAGNOSIS — I10 Essential (primary) hypertension: Secondary | ICD-10-CM | POA: Diagnosis not present

## 2023-02-23 DIAGNOSIS — G9341 Metabolic encephalopathy: Secondary | ICD-10-CM | POA: Diagnosis not present

## 2023-02-23 DIAGNOSIS — F1910 Other psychoactive substance abuse, uncomplicated: Secondary | ICD-10-CM | POA: Diagnosis not present

## 2023-02-23 DIAGNOSIS — R338 Other retention of urine: Secondary | ICD-10-CM | POA: Diagnosis not present

## 2023-02-23 MED ORDER — HYDROXYZINE HCL 25 MG PO TABS: 25.0000 mg | ORAL_TABLET | Freq: Four times a day (QID) | ORAL | 0 refills | Status: DC | PRN

## 2023-02-23 MED ORDER — FOLIC ACID 1 MG PO TABS: 1.0000 mg | ORAL_TABLET | Freq: Every day | ORAL | Status: DC

## 2023-02-23 MED ORDER — VITAMIN B-1 100 MG PO TABS: 100.0000 mg | ORAL_TABLET | Freq: Every day | ORAL | Status: DC

## 2023-02-23 NOTE — Plan of Care (Signed)

## 2023-02-23 NOTE — Plan of Care (Signed)
  Problem: Education: Goal: Knowledge of General Education information will improve Description: Including pain rating scale, medication(s)/side effects and non-pharmacologic comfort measures 02/23/2023 1522 by Doyce Para, RN Outcome: Adequate for Discharge 02/23/2023 1522 by Doyce Para, RN Outcome: Adequate for Discharge   Problem: Health Behavior/Discharge Planning: Goal: Ability to manage health-related needs will improve 02/23/2023 1522 by Doyce Para, RN Outcome: Adequate for Discharge 02/23/2023 1522 by Doyce Para, RN Outcome: Adequate for Discharge   Problem: Clinical Measurements: Goal: Ability to maintain clinical measurements within normal limits will improve 02/23/2023 1522 by Doyce Para, RN Outcome: Adequate for Discharge 02/23/2023 1522 by Doyce Para, RN Outcome: Adequate for Discharge Goal: Will remain free from infection 02/23/2023 1522 by Doyce Para, RN Outcome: Adequate for Discharge 02/23/2023 1522 by Doyce Para, RN Outcome: Adequate for Discharge Goal: Diagnostic test results will improve 02/23/2023 1522 by Doyce Para, RN Outcome: Adequate for Discharge 02/23/2023 1522 by Doyce Para, RN Outcome: Adequate for Discharge Goal: Respiratory complications will improve 02/23/2023 1522 by Doyce Para, RN Outcome: Adequate for Discharge 02/23/2023 1522 by Doyce Para, RN Outcome: Adequate for Discharge Goal: Cardiovascular complication will be avoided 02/23/2023 1522 by Doyce Para, RN Outcome: Adequate for Discharge 02/23/2023 1522 by Doyce Para, RN Outcome: Adequate for Discharge   Problem: Activity: Goal: Risk for activity intolerance will decrease 02/23/2023 1522 by Doyce Para, RN Outcome: Adequate for Discharge 02/23/2023 1522 by Doyce Para, RN Outcome: Adequate for Discharge   Problem: Nutrition: Goal: Adequate nutrition will be  maintained 02/23/2023 1522 by Doyce Para, RN Outcome: Adequate for Discharge 02/23/2023 1522 by Doyce Para, RN Outcome: Adequate for Discharge   Problem: Coping: Goal: Level of anxiety will decrease 02/23/2023 1522 by Doyce Para, RN Outcome: Adequate for Discharge 02/23/2023 1522 by Doyce Para, RN Outcome: Adequate for Discharge   Problem: Elimination: Goal: Will not experience complications related to bowel motility 02/23/2023 1522 by Doyce Para, RN Outcome: Adequate for Discharge 02/23/2023 1522 by Doyce Para, RN Outcome: Adequate for Discharge Goal: Will not experience complications related to urinary retention 02/23/2023 1522 by Doyce Para, RN Outcome: Adequate for Discharge 02/23/2023 1522 by Doyce Para, RN Outcome: Adequate for Discharge   Problem: Pain Managment: Goal: General experience of comfort will improve 02/23/2023 1522 by Doyce Para, RN Outcome: Adequate for Discharge 02/23/2023 1522 by Doyce Para, RN Outcome: Adequate for Discharge   Problem: Safety: Goal: Ability to remain free from injury will improve 02/23/2023 1522 by Doyce Para, RN Outcome: Adequate for Discharge 02/23/2023 1522 by Doyce Para, RN Outcome: Adequate for Discharge   Problem: Skin Integrity: Goal: Risk for impaired skin integrity will decrease 02/23/2023 1522 by Doyce Para, RN Outcome: Adequate for Discharge 02/23/2023 1522 by Doyce Para, RN Outcome: Adequate for Discharge

## 2023-02-23 NOTE — Progress Notes (Signed)
Transition of Care [facility]) - Inpatient Brief Assessment   Patient Details  Name: Kyle Singh MRN: [id number] Date of Birth: 1965/11/19  [facility]) CM/SW Contact:    Janae Bridgeman, RN Phone Number: 02/23/2023, 11:05 AM   Clinical Narrative: Patient admitted to hospital from [facility] jail - officer present at bedside.  The patient admitted for Acute metabolic encephalopathy.  Resources for substance abuse provided in the AVS.  Patient will return to [facility] jail when medically stable by [facility] transport.   Transition of Care Asessment: Insurance and Status: (P) Insurance coverage has been reviewed Patient has primary care physician: (P) Yes (Patient will return to [facility] when medically stable to discharge.  PCP follow up placed for community follow up.) Home environment has been reviewed: (P) from [facility] Prior level of function:: (P) Independent Prior/Current Home Services: (P) No current home services Social Determinants of Health Reivew: (P) SDOH reviewed needs interventions Readmission risk has been reviewed: (P) Yes Transition of care needs: (P) transition of care needs identified, TOC will continue to follow

## 2023-02-23 NOTE — Discharge Summary (Signed)
Physician Discharge Summary  Kyle Singh:[id number] DOB: 1966/02/01 DOA: 02/20/2023  PCP: Kyle Singh, No Pcp Per  Admit date: 02/20/2023 Discharge date: 02/23/2023  Time spent: 55 minutes  Recommendations for Outpatient Follow-up:  Kyle Singh will discharge back to jail.  Follow-up with MD in jail or with PCP in 2 weeks.   Discharge Diagnoses:  Principal Problem:   Acute metabolic encephalopathy Active Problems:   HTN (hypertension)   Acute urinary retention   Polysubstance abuse (HCC)   Discharge Condition: Stable and improved.  Diet recommendation: Regular  Filed Weights   02/20/23 0334 02/20/23 0930  Weight: 75 kg 64.5 kg    History of present illness:  HPI per Dr. Wynona Neat 57yo M PMH substance use disorder, HTN who presented to ED shortly after being transferred to jail with AMS. At jail he had some shaking that raised c/f sz prompting presentation. In ED he was still able to converse to a degree and there were c/f EtOH withdrawals initially. Started on CIWA, rcvd BZDs and then became increasingly agitated/erratic prompting precedex initiation, CT H, neuro consult for possible toxidromal syndrome.  Prior to developing significant agitation, denied substance use and etoh use. CTH without acute abnormality. Diaphoresis, tachycardia improved w BZD + precedex    Labs are grossly normal    PCCM is consulted in this setting.  Hospital Course:  #1 acute metabolic encephalopathy -Kyle Singh presented with signs and symptoms concerning for withdrawal from illicit substances or alcohol use. -Kyle Singh denies any polysubstance abuse however UDS was positive for amphetamines and cocaine on admission. -Alcohol level on admission noted at < 10. -CT head negative for any acute abnormalities. -Kyle Singh with no signs or symptoms of infection. -Kyle Singh seen in consultation by neurology who also felt Kyle Singh likely withdrawing. -Due to Kyle Singh's worsening agitation and erratic behavior Kyle Singh  transferred to the ICU and placed on a Precedex drip. -Precedex drip weaned off and Kyle Singh subsequently placed on as needed Ativan. -Kyle Singh improved clinically, alert and oriented to self place and time, less agitated and noted not to require significant amounts of Ativan.  -Kyle Singh placed on the clonidine detox protocol, hydration with IV fluids and improved clinically during the hospitalization.   -Kyle Singh maintained on thiamine, multivitamin, folic acid.   -Kyle Singh improved clinically and was back to baseline by day of discharge.   -Kyle Singh was discharged in stable and improved condition.   2.  Polysubstance abuse -Kyle Singh denied polysubstance abuse, although UDS was positive for amphetamines and cocaine. -Polysubstance cessation stressed to Kyle Singh.   3.  Urinary retention/decreased urine output -Kyle Singh noted in  ICU noted to have required 2 I and O cath for urinary retention. -Kyle Singh is urinary retention improved, Kyle Singh was having good urine output by day of discharge.   4.  Hypertension -BP remained stable throughout the hospitalization. -Kyle Singh noted not on any antihypertensive medications prior to admission. -Outpatient follow-up.  Procedures: CT head 02/20/2023 Chest x-ray 02/20/2023   Significant Hospital Events: Including procedures, antibiotic start and stop dates in addition to other pertinent events   8/2 to ED after being transferred to jail with AMS ? Sz activity  8/3 off Precedex drip, requiring very infrequent Ativan pushes for elevated CIWA score  Consultations: Neurology: Dr.Khaliqdina 02/20/2023    Discharge Exam: Vitals:   02/23/23 0450 02/23/23 0810  BP: 116/85 139/85  Pulse: 89 88  Resp: 18   Temp: (!) 97.4 F (36.3 C) 97.8 F (36.6 C)  SpO2: 98% 99%    General: NAD. Cardiovascular: CTAB.  No wheezes, no crackles, no rhonchi.  Fair air movement.  Speaking in full sentences. Respiratory: Clear to auscultation bilaterally.  No wheezes, no  crackles, no rhonchi.  Fair air movement.  Speaking in full sentences.  Discharge Instructions   Discharge Instructions     Diet general   Complete by: As directed    Increase activity slowly   Complete by: As directed       Allergies as of 02/23/2023   No Known Allergies      Medication List     TAKE these medications    folic acid 1 MG tablet Commonly known as: FOLVITE Take 1 tablet (1 mg total) by mouth daily. Start taking on: February 24, 2023   hydrOXYzine 25 MG tablet Commonly known as: ATARAX Take 1 tablet (25 mg total) by mouth every 6 (six) hours as needed for anxiety.   thiamine 100 MG tablet Commonly known as: Vitamin B-1 Take 1 tablet (100 mg total) by mouth daily. Start taking on: February 24, 2023       No Known Allergies  Follow-up Information      [facility]. Schedule an appointment as soon as possible for a visit in 2 week(s).   Why: Call the clinic and schedule appointment. Contact information: [address] [place] [address] [phone]        Follow-up with MD at jail. Follow up.                   The results of significant diagnostics from this hospitalization (including imaging, microbiology, ancillary and laboratory) are listed below for reference.    Significant Diagnostic Studies: CT Head Wo Contrast  Result Date: 02/20/2023 CLINICAL DATA:  Mental status change with unknown cause. EXAM: CT HEAD WITHOUT CONTRAST TECHNIQUE: Contiguous axial images were obtained from the base of the skull through the vertex without intravenous contrast. RADIATION DOSE REDUCTION: This exam was performed according to the departmental dose-optimization program which includes automated exposure control, adjustment of the mA and/or kV according to Kyle Singh size and/or use of iterative reconstruction technique. COMPARISON:  None Available. FINDINGS: Brain: No evidence of acute infarction,  hemorrhage, hydrocephalus, extra-axial collection or mass lesion/mass effect. Vascular: No hyperdense vessel or unexpected calcification. Skull: Normal. Negative for fracture or focal lesion. Sinuses/Orbits: No acute finding. Other: Moderate motion artifact IMPRESSION: 1. No acute finding. 2. Moderate motion artifact. Electronically Signed   By: Tiburcio Pea M.D.   On: 02/20/2023 06:30   DG Chest Portable 1 View  Result Date: 02/20/2023 CLINICAL DATA:  Withdrawal symptoms. EXAM: PORTABLE CHEST 1 VIEW COMPARISON:  08/23/2018 FINDINGS: Artifact from EKG leads. Normal heart size and mediastinal contours. Symmetric nipple shadows. No acute infiltrate or edema. No effusion or pneumothorax. No acute osseous findings. IMPRESSION: Negative portable chest. Electronically Signed   By: Tiburcio Pea M.D.   On: 02/20/2023 04:46    Microbiology: Recent Results (from the past 240 hour(s))  MRSA Next Gen by PCR, Nasal     Status: None   Collection Time: 02/20/23  9:35 AM   Specimen: Nasal Mucosa; Nasal Swab  Result Value Ref Range Status   MRSA by PCR Next Gen NOT DETECTED NOT DETECTED Final    Comment: (NOTE) The GeneXpert MRSA Assay (FDA approved for NASAL specimens only), is one component of a comprehensive MRSA colonization surveillance program. It is not intended to diagnose MRSA infection nor to guide or monitor treatment for MRSA infections. Test performance  is not FDA approved in patients less than 32 years old. Performed at [facility], [address], [place], [place] [address]      Labs: Basic Metabolic Panel: Recent Labs  Lab 02/20/23 0354 02/20/23 0437 02/20/23 0446 02/22/23 0929 02/23/23 0243  NA 139 139 141 135 134*  K 3.8 3.8 4.1 3.6 3.8  CL 101  --  103 100 97*  CO2 24  --   --  28 29  GLUCOSE 124*  --  128* 163* 113*  BUN 17  --  23* 12 14  CREATININE 1.11  --  1.00 0.91 0.94  CALCIUM 9.3  --   --  8.2* 8.4*  MG  --   --   --  2.0  --   PHOS  --   --   --  3.2   --    Liver Function Tests: Recent Labs  Lab 02/20/23 0354 02/22/23 0929  AST 24  --   ALT 23  --   ALKPHOS 70  --   BILITOT 0.9  --   PROT 7.8  --   ALBUMIN 4.4 2.9*   Recent Labs  Lab 02/20/23 0354  LIPASE 33   Recent Labs  Lab 02/20/23 1125  AMMONIA 39*   CBC: Recent Labs  Lab 02/20/23 0354 02/20/23 0437 02/20/23 0446 02/22/23 0929  WBC 12.8*  --   --  8.5  NEUTROABS 11.4*  --   --  6.2  HGB 16.0 15.0 16.0 13.5  HCT 47.1 44.0 47.0 40.1  MCV 89.9  --   --  92.6  PLT 211  --   --  214   Cardiac Enzymes: Recent Labs  Lab 02/20/23 0354  CKTOTAL 261   BNP: BNP (last 3 results) No results for input(s): "BNP" in the last 8760 hours.  ProBNP (last 3 results) No results for input(s): "PROBNP" in the last 8760 hours.  CBG: Recent Labs  Lab 02/20/23 0330  GLUCAP 128*       Signed:  Ramiro Harvest MD.  [facility] 02/23/2023, 3:05 PM

## 2023-10-12 ENCOUNTER — Ambulatory Visit (HOSPITAL_COMMUNITY): Payer: Self-pay

## 2023-12-31 ENCOUNTER — Ambulatory Visit (HOSPITAL_COMMUNITY)
Admission: RE | Admit: 2023-12-31 | Discharge: 2023-12-31 | Disposition: A | Discharge status: Home / Self Care | Payer: Self-pay | Source: Ambulatory Visit | Attending: Family Medicine | Admitting: Family Medicine

## 2023-12-31 ENCOUNTER — Encounter (HOSPITAL_COMMUNITY): Payer: Self-pay

## 2023-12-31 VITALS — BP 130/81 | HR 76 | Temp 98.4°F | Resp 18

## 2023-12-31 DIAGNOSIS — M25579 Pain in unspecified ankle and joints of unspecified foot: Secondary | ICD-10-CM | POA: Insufficient documentation

## 2023-12-31 LAB — HIV ANTIBODY (ROUTINE TESTING W REFLEX): HIV Screen 4th Generation wRfx: NONREACTIVE

## 2023-12-31 MED ORDER — CEFTRIAXONE SODIUM 1 G IJ SOLR
1.0000 g | Freq: Once | INTRAMUSCULAR | Status: AC
  Administered 2023-12-31: 1 g via INTRAMUSCULAR

## 2023-12-31 MED ORDER — CEFTRIAXONE SODIUM 1 G IJ SOLR
INTRAMUSCULAR | Status: AC
  Filled 2023-12-31: qty 10

## 2023-12-31 MED ORDER — LIDOCAINE HCL (PF) 1 % IJ SOLN
INTRAMUSCULAR | Status: AC
  Filled 2023-12-31: qty 2

## 2023-12-31 MED ORDER — MELOXICAM 7.5 MG PO TABS: 7.5000 mg | ORAL_TABLET | Freq: Every day | ORAL | 0 refills | Status: DC

## 2023-12-31 MED ORDER — DOXYCYCLINE HYCLATE 100 MG PO CAPS: 100.0000 mg | ORAL_CAPSULE | Freq: Two times a day (BID) | ORAL | 0 refills | Status: AC

## 2023-12-31 NOTE — ED Provider Notes (Signed)
 [facility]    CSN: [id number] Arrival date & time: 12/31/23  7829      History   Chief Complaint Chief Complaint  Patient presents with   Joint Pain    pain all over hard to move - Entered by patient    HPI Kyle Singh is a 58 y.o. male.   The patient reports having an episode of scrotal swelling 3 months ago which resolved after a week. This was not associated with an illness, penile discharge or lesions, fevers, chills, rashes, or lymph node swelling. Since then he has had several episodes of pain and swelling in the wrists, knees, and ankles that is not triggered by injury or activity, alleviated by NSAIDs, and spontaneously resolved. His current pain is in the same locations with the same presentation. He does report being sexually active prior to his first episode but denies knowledge of exposure to STDs.   The history is provided by the patient.    Past Medical History:  Diagnosis Date   Back pain    Hypertension     Patient Active Problem List   Diagnosis Date Noted   Acute urinary retention 02/22/2023   Polysubstance abuse (HCC) 02/22/2023   Acute metabolic encephalopathy 02/20/2023   Post-op pain 08/27/2018   S/P lumbar laminectomy 08/25/2018   Cocaine abuse (HCC) 08/24/2018   HTN (hypertension) 08/24/2018   Back pain 08/23/2018    Past Surgical History:  Procedure Laterality Date   LUMBAR LAMINECTOMY/DECOMPRESSION MICRODISCECTOMY Right 08/25/2018   Procedure: Right Lumbar four-five Extraforminal Microdiskectomy;  Surgeon: Isadora Mar, MD;  Location: [facility] OR;  Service: Neurosurgery;  Laterality: Right;   NO PAST SURGERIES         Home Medications    Prior to Admission medications   Medication Sig Start Date End Date Taking? Authorizing Provider  doxycycline (VIBRAMYCIN) 100 MG capsule Take 1 capsule (100 mg total) by mouth 2 (two) times daily for 10 days. 12/31/23 01/10/24 Yes Claybon Cuna, MD  meloxicam (MOBIC) 7.5 MG tablet Take  1 tablet (7.5 mg total) by mouth daily. 12/31/23  Yes Claybon Cuna, MD    Family History History reviewed. No pertinent family history.  Social History Social History   Tobacco Use   Smoking status: Every Day    Current packs/day: 1.00    Average packs/day: 1 pack/day for 12.0 years (12.0 ttl pk-yrs)    Types: Cigarettes   Smokeless tobacco: Never  Vaping Use   Vaping status: Never Used  Substance Use Topics   Alcohol use: No   Drug use: Not Currently    Types: Cocaine, Marijuana     Allergies   Patient has no known allergies.   Review of Systems Review of Systems  Constitutional:  Negative for activity change, appetite change, chills, diaphoresis, fatigue, fever and unexpected weight change.  HENT:  Negative for congestion and sore throat.   Eyes:  Negative for photophobia and visual disturbance.  Respiratory:  Negative for cough and shortness of breath.   Cardiovascular:  Negative for chest pain and palpitations.  Gastrointestinal:  Negative for abdominal pain, nausea and vomiting.  Genitourinary:  Negative for decreased urine volume, dysuria, frequency, genital sores, hematuria, penile discharge, penile pain, penile swelling, scrotal swelling and testicular pain.  Musculoskeletal:  Positive for arthralgias and joint swelling. Negative for back pain, gait problem, myalgias, neck pain and neck stiffness.  Skin:  Negative for color change and rash.  Neurological:  Negative for dizziness, tremors, seizures,  syncope, weakness, light-headedness, numbness and headaches.  Hematological:  Negative for adenopathy.  Psychiatric/Behavioral:  Negative for agitation, confusion and hallucinations.      Physical Exam Triage Vital Signs ED Triage Vitals [12/31/23 1009]  Encounter Vitals Group     BP 130/81     Girls Systolic BP Percentile      Girls Diastolic BP Percentile      Boys Systolic BP Percentile      Boys Diastolic BP Percentile      Pulse Rate 76     Resp 18      Temp 98.4 F (36.9 C)     Temp Source Oral     SpO2 96 %     Weight      Height      Head Circumference      Peak Flow      Pain Score 4     Pain Loc      Pain Education      Exclude from Growth Chart    No data found.  Updated Vital Signs BP 130/81 (BP Location: Left Arm)   Pulse 76   Temp 98.4 F (36.9 C) (Oral)   Resp 18   SpO2 96%   Visual Acuity Right Eye Distance:   Left Eye Distance:   Bilateral Distance:    Right Eye Near:   Left Eye Near:    Bilateral Near:     Physical Exam Vitals reviewed.  Constitutional:      General: He is not in acute distress.    Appearance: Normal appearance. He is not ill-appearing, toxic-appearing or diaphoretic.  HENT:     Head: Normocephalic and atraumatic.     Nose: No congestion.   Eyes:     General: No scleral icterus.    Extraocular Movements: Extraocular movements intact.     Pupils: Pupils are equal, round, and reactive to light.    Musculoskeletal:     Comments: There is some mild swelling of the hands bilaterally Wrist and finger ROM full and strength 5/5 No sensory deficits The knees have no deformity, erythema, or effusions ROM full and strength 5/5 in extension and flexion Gait normal   Skin:    General: Skin is warm.     Capillary Refill: Capillary refill takes 2 to 3 seconds.     Coloration: Skin is not jaundiced.     Findings: No rash.   Neurological:     General: No focal deficit present.     Mental Status: He is alert and oriented to person, place, and time.     Sensory: No sensory deficit.     Gait: Gait normal.   Psychiatric:        Mood and Affect: Mood normal.        Behavior: Behavior normal.        Thought Content: Thought content normal.        Judgment: Judgment normal.      UC Treatments / Results  Labs (all labs ordered are listed, but only abnormal results are displayed) Labs Reviewed  HIV ANTIBODY (ROUTINE TESTING W REFLEX)  RPR    EKG   Radiology No results  found.  Procedures Procedures (including critical care time)  Medications Ordered in UC Medications  cefTRIAXone (ROCEPHIN) injection 1 g (1 g Intramuscular Given 12/31/23 1143)    Initial Impression / Assessment and Plan / UC Course  I have reviewed the triage vital signs and the nursing notes.  Pertinent labs &  imaging results that were available during my care of the patient were reviewed by me and considered in my medical decision making (see chart for details).     Polyarticular Joint pain and swelling -Given the patient's history of testicular swelling after being sexually active, viral type illness, and then delayed onset of polyarticular joint pain and swelling the differential includes reactive arthritis secondary to STD infection versus osteoarthritis or rheumatoid arthritis. - He has not had any penile discharge, rashes, or other symptoms.  Unfortunately this clinic is restricted to urethral swab testing for gonorrhea and chlamydia.  Given no blood testing available we will presumptively treat for gonorrhea and chlamydia with Rocephin and doxycycline - HIV and RPR pending - I gave the patient contact information for the family medicine clinic and he voiced intention to go establish with them as he has not had a PCP for 50 years. - He can start meloxicam daily.  I started the lower dose given his lack of previous laboratory testing for kidney function - The patient voiced understanding and agreement with the plan.  Final Clinical Impressions(s) / UC Diagnoses   Final diagnoses:  Pain in joint involving ankle and foot, unspecified laterality   Discharge Instructions   None    ED Prescriptions     Medication Sig Dispense Auth. Provider   meloxicam (MOBIC) 7.5 MG tablet Take 1 tablet (7.5 mg total) by mouth daily. 30 tablet Kemet Nijjar E, MD   doxycycline (VIBRAMYCIN) 100 MG capsule Take 1 capsule (100 mg total) by mouth 2 (two) times daily for 10 days. 20 capsule  Claybon Cuna, MD      PDMP not reviewed this encounter.   Claybon Cuna, MD 12/31/23 2211

## 2023-12-31 NOTE — ED Triage Notes (Signed)
 Pt c/o joint pain to fingers, wrist, elbows, and knees for 3 months. States took tylenol  with some relief.

## 2024-01-01 LAB — RPR: RPR Ser Ql: NONREACTIVE

## 2024-01-26 ENCOUNTER — Ambulatory Visit (HOSPITAL_COMMUNITY)
Admission: RE | Admit: 2024-01-26 | Discharge: 2024-01-26 | Disposition: A | Discharge status: Home / Self Care | Payer: Self-pay | Source: Ambulatory Visit | Attending: Emergency Medicine | Admitting: Emergency Medicine

## 2024-01-26 ENCOUNTER — Encounter (HOSPITAL_COMMUNITY): Payer: Self-pay

## 2024-01-26 VITALS — BP 129/85 | HR 93 | Temp 98.3°F | Resp 16

## 2024-01-26 DIAGNOSIS — M2550 Pain in unspecified joint: Secondary | ICD-10-CM

## 2024-01-26 MED ORDER — MELOXICAM 15 MG PO TABS: 15.0000 mg | ORAL_TABLET | Freq: Every day | ORAL | 0 refills | Status: DC

## 2024-01-26 NOTE — ED Provider Notes (Signed)
 [facility]    CSN: [id number] Arrival date & time: 01/26/24  1202      History   Chief Complaint Chief Complaint  Patient presents with   Joint Pain    Swollen and in pain - Entered by patient    HPI Kyle Singh is a 58 y.o. male.  3-4 month history of multiple joint pain Reports bilateral shoulders, wrists, hips, and knees. Sometimes also the ankles. No fevers  Denies injury, trauma, fall. No tick bites reported. No prior auto-immune disease known.  Has used tylenol  occasionally  Was seen here for this one month ago. Prescribed meloxicam  but he never started it, states the pharmacy didn't have it.  Was advised to call primary care for follow up but did not. Per previous visit note he has not had a PCP for 50 years  He does not have insurance currently but is in the process of applying for medicaid.  Past Medical History:  Diagnosis Date   Back pain    Hypertension     Patient Active Problem List   Diagnosis Date Noted   Acute urinary retention 02/22/2023   Polysubstance abuse (HCC) 02/22/2023   Acute metabolic encephalopathy 02/20/2023   Post-op pain 08/27/2018   S/P lumbar laminectomy 08/25/2018   Cocaine abuse (HCC) 08/24/2018   HTN (hypertension) 08/24/2018   Back pain 08/23/2018    Past Surgical History:  Procedure Laterality Date   LUMBAR LAMINECTOMY/DECOMPRESSION MICRODISCECTOMY Right 08/25/2018   Procedure: Right Lumbar four-five Extraforminal Microdiskectomy;  Surgeon: Joshua Alm RAMAN, MD;  Location: [facility] OR;  Service: Neurosurgery;  Laterality: Right;   NO PAST SURGERIES         Home Medications    Prior to Admission medications   Medication Sig Start Date End Date Taking? Authorizing Provider  meloxicam  (MOBIC ) 15 MG tablet Take 1 tablet (15 mg total) by mouth daily. 01/26/24  Yes Khaleesi Gruel, [place] RIGGERS    Family History History reviewed. No pertinent family history.  Social History Social History   Tobacco Use   Smoking  status: Every Day    Current packs/day: 1.00    Average packs/day: 1 pack/day for 12.0 years (12.0 ttl pk-yrs)    Types: Cigarettes   Smokeless tobacco: Never  Vaping Use   Vaping status: Never Used  Substance Use Topics   Alcohol use: No   Drug use: Not Currently    Types: Cocaine, Marijuana     Allergies   Patient has no known allergies.   Review of Systems Review of Systems As per HPI  Physical Exam Triage Vital Signs ED Triage Vitals  Encounter Vitals Group     BP 01/26/24 1221 129/85     Girls Systolic BP Percentile --      Girls Diastolic BP Percentile --      Boys Systolic BP Percentile --      Boys Diastolic BP Percentile --      Pulse Rate 01/26/24 1221 93     Resp 01/26/24 1221 16     Temp 01/26/24 1221 98.3 F (36.8 C)     Temp Source 01/26/24 1221 Oral     SpO2 01/26/24 1221 96 %     Weight --      Height --      Head Circumference --      Peak Flow --      Pain Score 01/26/24 1222 8     Pain Loc --      Pain Education --  Exclude from Growth Chart --    No data found.  Updated Vital Signs BP 129/85 (BP Location: Left Arm)   Pulse 93   Temp 98.3 F (36.8 C) (Oral)   Resp 16   SpO2 96%    Physical Exam Vitals and nursing note reviewed.  Constitutional:      General: He is not in acute distress. HENT:     Mouth/Throat:     Pharynx: Oropharynx is clear.  Cardiovascular:     Rate and Rhythm: Normal rate and regular rhythm.     Pulses: Normal pulses.     Heart sounds: Normal heart sounds.  Pulmonary:     Effort: Pulmonary effort is normal.     Breath sounds: Normal breath sounds.  Abdominal:     Palpations: Abdomen is soft.     Tenderness: There is no abdominal tenderness.  Musculoskeletal:        General: Normal range of motion.     Right shoulder: Normal.     Left shoulder: Normal.     Right elbow: Normal.     Left elbow: Normal.     Right wrist: Normal.     Left wrist: Normal.     Right hand: Tenderness present. No  swelling, deformity or bony tenderness.     Left hand: Tenderness present. No swelling, deformity or bony tenderness.     Cervical back: Normal.     Thoracic back: Normal.     Lumbar back: Normal.     Right hip: Normal.     Left hip: Normal.     Right knee: No swelling, deformity or effusion. Normal range of motion. Tenderness present.     Left knee: No swelling, deformity or effusion. Normal range of motion. Tenderness present.     Right ankle: Normal.     Left ankle: Normal.  Skin:    General: Skin is warm and dry.     Capillary Refill: Capillary refill takes less than 2 seconds.  Neurological:     Mental Status: He is alert and oriented to person, place, and time.     UC Treatments / Results  Labs (all labs ordered are listed, but only abnormal results are displayed) Labs Reviewed - No data to display  EKG   Radiology No results found.  Procedures Procedures (including critical care time)  Medications Ordered in UC Medications - No data to display  Initial Impression / Assessment and Plan / UC Course  I have reviewed the triage vital signs and the nursing notes.  Pertinent labs & imaging results that were available during my care of the patient were reviewed by me and considered in my medical decision making (see chart for details).  Stable vitals  3 month history of polyarthralgia No primary care for majority of his life Unfortunately does not have insurance currently but is working on Health visitor.  I do not recommend any blood work at this time given self pay.  I have provided him with the community health and wellness clinic information. Once he gets insurance established he can also set up with PCP online which may be sooner availability.  Meloxicam  is resent to pharmacy, take once daily. GoodRx is advised for cheapest medication   Final Clinical Impressions(s) / UC Diagnoses   Final diagnoses:  Polyarthralgia     Discharge Instructions       I have sent the meloxicam  prescription to [facility] instead of [facility]. The [facility] prescription will be much cheaper if you  go online and make a FREE [employer] account ($11 instead of $28)  Take the meloxicam  one tablet daily. This is to help with joint pain.  Please continue to work on setting up your insurance. Once active, you can scan the QR code on the last page to get established with a primary care provider. Do this as soon as possible!  If insurance is taking longer than expected, you can call the [facility] clinic. This is a low cost clinic.  It's very important to follow with a primary care as soon as able.      ED Prescriptions     Medication Sig Dispense Auth. Provider   meloxicam  (MOBIC ) 15 MG tablet Take 1 tablet (15 mg total) by mouth daily. 30 tablet Jericka Kadar, [place], PA-C      PDMP not reviewed this encounter.   Icy Fuhrmann, [place], [place] 01/26/24 1407

## 2024-01-26 NOTE — Discharge Instructions (Addendum)
 I have sent the meloxicam  prescription to [facility] instead of [facility]. The [facility] prescription will be much cheaper if you go online and make a FREE [employer] account ($11 instead of $28)  Take the meloxicam  one tablet daily. This is to help with joint pain.  Please continue to work on setting up your insurance. Once active, you can scan the QR code on the last page to get established with a primary care provider. Do this as soon as possible!  If insurance is taking longer than expected, you can call the [facility] and Wellness clinic. This is a low cost clinic.  It's very important to follow with a primary care as soon as able.

## 2024-01-26 NOTE — ED Triage Notes (Signed)
 Patient here today with c/o multiple joint pain. Patient was here last month with the same symptoms. Patient states that he never filled the Meloxicam .

## 2024-02-24 ENCOUNTER — Ambulatory Visit
Admission: EM | Admit: 2024-02-24 | Discharge: 2024-02-24 | Disposition: A | Discharge status: Home / Self Care | Payer: Self-pay

## 2024-02-24 DIAGNOSIS — M2550 Pain in unspecified joint: Secondary | ICD-10-CM

## 2024-02-24 MED ORDER — MELOXICAM 15 MG PO TABS: 15.0000 mg | ORAL_TABLET | Freq: Every day | ORAL | 0 refills | Status: AC

## 2024-02-24 NOTE — ED Triage Notes (Signed)
 I have been to the Urgent [facility] on [address] for this but did not know it was this until now, they checked me for STI's and Aids and it wasn't either of them but I think it is Mold. My symptoms remain to be joint pain, eyes watery, hard time breathing at times, left foot/ankle swelling some. I am in a motel now but trying to eliminate mold now and I need to know what this is and if it is related. This has been going on for 2-3 mos. No PCP yet.

## 2024-02-24 NOTE — ED Provider Notes (Signed)
 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  Symptoms have been ongoing over 3 months at this point.  He is concerned for possible mold exposure.  Discussed that we were unable to provide further evaluation at this point and he would be better served by primary care.  Appointment made for same.  Recommended further evaluation in the emergency room if symptoms worsen in any way in the interim.  Patient expressed understanding.  Meloxicam  refilled at this time.   Billy Asberry FALCON, PA-C 02/24/24 [id number]

## 2024-03-31 ENCOUNTER — Telehealth: Payer: Self-pay | Admitting: General Practice

## 2024-03-31 ENCOUNTER — Encounter: Payer: Self-pay | Admitting: Family

## 2024-03-31 NOTE — Progress Notes (Signed)
 Erroneous encounter-disregard

## 2024-03-31 NOTE — Telephone Encounter (Signed)
 Called pt to reschedule missed NP appt at [facility]; could not reach or leave vm
